# Patient Record
Sex: Male | Born: 1975 | Race: Black or African American | Hispanic: No | Marital: Single
Health system: Southern US, Community
[De-identification: ages and names within clinical notes are randomized; demographics above are authoritative.]

## PROBLEM LIST (undated history)

## (undated) DIAGNOSIS — N183 Chronic kidney disease, stage 3 unspecified: Secondary | ICD-10-CM

## (undated) DIAGNOSIS — G4733 Obstructive sleep apnea (adult) (pediatric): Secondary | ICD-10-CM

## (undated) DIAGNOSIS — J9621 Acute and chronic respiratory failure with hypoxia: Secondary | ICD-10-CM

## (undated) DIAGNOSIS — R1312 Dysphagia, oropharyngeal phase: Secondary | ICD-10-CM

## (undated) DIAGNOSIS — E662 Morbid (severe) obesity with alveolar hypoventilation: Secondary | ICD-10-CM

---

## 2020-09-10 ENCOUNTER — Inpatient Hospital Stay
Admission: AD | Admit: 2020-09-10 | Discharge: 2020-10-06 | Disposition: A | Discharge status: Home / Self Care | Payer: Medicare HMO | Source: Other Acute Inpatient Hospital | Attending: Internal Medicine | Admitting: Internal Medicine

## 2020-09-10 DIAGNOSIS — K5900 Constipation, unspecified: Secondary | ICD-10-CM

## 2020-09-10 DIAGNOSIS — R1312 Dysphagia, oropharyngeal phase: Secondary | ICD-10-CM | POA: Diagnosis present

## 2020-09-10 DIAGNOSIS — J9690 Respiratory failure, unspecified, unspecified whether with hypoxia or hypercapnia: Secondary | ICD-10-CM

## 2020-09-10 DIAGNOSIS — R1110 Vomiting, unspecified: Secondary | ICD-10-CM

## 2020-09-10 DIAGNOSIS — J189 Pneumonia, unspecified organism: Secondary | ICD-10-CM

## 2020-09-10 DIAGNOSIS — Z4659 Encounter for fitting and adjustment of other gastrointestinal appliance and device: Secondary | ICD-10-CM

## 2020-09-10 DIAGNOSIS — K567 Ileus, unspecified: Secondary | ICD-10-CM

## 2020-09-10 DIAGNOSIS — E662 Morbid (severe) obesity with alveolar hypoventilation: Secondary | ICD-10-CM | POA: Diagnosis present

## 2020-09-10 DIAGNOSIS — Z9911 Dependence on respirator [ventilator] status: Secondary | ICD-10-CM

## 2020-09-10 DIAGNOSIS — N183 Chronic kidney disease, stage 3 unspecified: Secondary | ICD-10-CM | POA: Diagnosis present

## 2020-09-10 DIAGNOSIS — J9621 Acute and chronic respiratory failure with hypoxia: Secondary | ICD-10-CM | POA: Diagnosis present

## 2020-09-10 DIAGNOSIS — G4733 Obstructive sleep apnea (adult) (pediatric): Secondary | ICD-10-CM | POA: Diagnosis present

## 2020-09-10 HISTORY — DX: Chronic kidney disease, stage 3 unspecified: N18.30

## 2020-09-10 HISTORY — DX: Dysphagia, oropharyngeal phase: R13.12

## 2020-09-10 HISTORY — DX: Morbid (severe) obesity with alveolar hypoventilation: E66.2

## 2020-09-10 HISTORY — DX: Obstructive sleep apnea (adult) (pediatric): G47.33

## 2020-09-10 HISTORY — DX: Morbid (severe) obesity due to excess calories: E66.01

## 2020-09-10 HISTORY — DX: Acute and chronic respiratory failure with hypoxia: J96.21

## 2020-09-11 ENCOUNTER — Other Ambulatory Visit (HOSPITAL_COMMUNITY): Payer: Self-pay

## 2020-09-11 ENCOUNTER — Encounter: Payer: Self-pay | Admitting: Internal Medicine

## 2020-09-11 DIAGNOSIS — N183 Chronic kidney disease, stage 3 unspecified: Secondary | ICD-10-CM

## 2020-09-11 DIAGNOSIS — G4733 Obstructive sleep apnea (adult) (pediatric): Secondary | ICD-10-CM | POA: Diagnosis not present

## 2020-09-11 DIAGNOSIS — J9621 Acute and chronic respiratory failure with hypoxia: Secondary | ICD-10-CM | POA: Diagnosis not present

## 2020-09-11 DIAGNOSIS — R1312 Dysphagia, oropharyngeal phase: Secondary | ICD-10-CM | POA: Diagnosis not present

## 2020-09-11 DIAGNOSIS — E662 Morbid (severe) obesity with alveolar hypoventilation: Secondary | ICD-10-CM | POA: Diagnosis present

## 2020-09-11 LAB — CBC WITH DIFFERENTIAL/PLATELET
Abs Immature Granulocytes: 0.01 10*3/uL (ref 0.00–0.07)
Basophils Absolute: 0 10*3/uL (ref 0.0–0.1)
Basophils Relative: 1 %
Eosinophils Absolute: 0 10*3/uL (ref 0.0–0.5)
Eosinophils Relative: 0 %
HCT: 49.4 % (ref 39.0–52.0)
Hemoglobin: 16.2 g/dL (ref 13.0–17.0)
Immature Granulocytes: 0 %
Lymphocytes Relative: 42 %
Lymphs Abs: 1.6 10*3/uL (ref 0.7–4.0)
MCH: 26.7 pg (ref 26.0–34.0)
MCHC: 32.8 g/dL (ref 30.0–36.0)
MCV: 81.5 fL (ref 80.0–100.0)
Monocytes Absolute: 0.6 10*3/uL (ref 0.1–1.0)
Monocytes Relative: 16 %
Neutro Abs: 1.6 10*3/uL — ABNORMAL LOW (ref 1.7–7.7)
Neutrophils Relative %: 41 %
Platelets: 165 10*3/uL (ref 150–400)
RBC: 6.06 MIL/uL — ABNORMAL HIGH (ref 4.22–5.81)
RDW: 18.4 % — ABNORMAL HIGH (ref 11.5–15.5)
WBC: 3.7 10*3/uL — ABNORMAL LOW (ref 4.0–10.5)
nRBC: 0 % (ref 0.0–0.2)

## 2020-09-11 LAB — BLOOD GAS, ARTERIAL
Acid-Base Excess: 8.1 mmol/L — ABNORMAL HIGH (ref 0.0–2.0)
Bicarbonate: 32.4 mmol/L — ABNORMAL HIGH (ref 20.0–28.0)
FIO2: 60
O2 Saturation: 90.1 %
Patient temperature: 36.7
pCO2 arterial: 46.9 mmHg (ref 32.0–48.0)
pH, Arterial: 7.452 — ABNORMAL HIGH (ref 7.350–7.450)
pO2, Arterial: 56.7 mmHg — ABNORMAL LOW (ref 83.0–108.0)

## 2020-09-11 LAB — COMPREHENSIVE METABOLIC PANEL
ALT: 38 U/L (ref 0–44)
AST: 25 U/L (ref 15–41)
Albumin: 2.5 g/dL — ABNORMAL LOW (ref 3.5–5.0)
Alkaline Phosphatase: 51 U/L (ref 38–126)
Anion gap: 9 (ref 5–15)
BUN: 5 mg/dL — ABNORMAL LOW (ref 6–20)
CO2: 29 mmol/L (ref 22–32)
Calcium: 8.7 mg/dL — ABNORMAL LOW (ref 8.9–10.3)
Chloride: 96 mmol/L — ABNORMAL LOW (ref 98–111)
Creatinine, Ser: 1.3 mg/dL — ABNORMAL HIGH (ref 0.61–1.24)
GFR, Estimated: >60 mL/min (ref >60)
Glucose, Bld: 106 mg/dL — ABNORMAL HIGH (ref 70–99)
Potassium: 3.3 mmol/L — ABNORMAL LOW (ref 3.5–5.1)
Sodium: 134 mmol/L — ABNORMAL LOW (ref 135–145)
Total Bilirubin: 0.6 mg/dL (ref 0.3–1.2)
Total Protein: 7.5 g/dL (ref 6.5–8.1)

## 2020-09-11 LAB — APTT: aPTT: 38 seconds — ABNORMAL HIGH (ref 24–36)

## 2020-09-11 LAB — PHOSPHORUS: Phosphorus: 2.4 mg/dL — ABNORMAL LOW (ref 2.5–4.6)

## 2020-09-11 LAB — MAGNESIUM: Magnesium: 1.5 mg/dL — ABNORMAL LOW (ref 1.7–2.4)

## 2020-09-11 LAB — PROTIME-INR
INR: 1.2 (ref 0.8–1.2)
Prothrombin Time: 14.9 seconds (ref 11.4–15.2)

## 2020-09-11 NOTE — Consult Note (Signed)
Pulmonary Critical Care Medicine [facility] [place]  PULMONARY SERVICE  Date of Service: 09/11/2020  PULMONARY CRITICAL CARE CONSULT   George Pennington  OFB:[id number]  DOB: 14-Dec-1975   DOA: 09/10/2020  Referring Physician: Carron Curie, MD  HPI: George Pennington is a 44 y.o. male seen for follow up of Acute on Chronic Respiratory Failure.  Patient has multiple medical problems including pickwickian syndrome oropharyngeal dysphagia hypernatremia polycythemia obstructive sleep apnea class III severe obesity schizophrenia bipolar disorder who presented to [facility] on November 6 with mental status changes and lethargy.  Patient also apparently is noncompliant with CPAP despite having severe sleep apnea.  Has been noted to have polycythemia felt to be secondary to OSA and obesity hypoventilation.  Patient also has a history of smoking.  When he presented his hemoglobin was 22.  Patient was intubated after failing attempts at BiPAP.  The patient subsequently was not able to come off the ventilator had a tracheostomy done.  Now presents to our facility for further management.  On evaluation the patient is focused on wanting to eat and also coming off the ventilator.  Reportedly the patient was allowed at the other facility to eat requiring 100% FiO2 however I feel like he is a very high risk to eat at this stage until we are able to do our own evaluation.  Review of Systems:  ROS performed and is unremarkable other than noted above.  Past Medical History:  Diagnosis Date  . Bipolar 1 disorder (*)  . Diabetes mellitus (*)  . Schizophrenia (*) Polycythemia Obstructive sleep apnea Severe morbid obesity Schizophrenia Pickwickian syndrome Stage II chronic kidney disease  Past Surgical History:  Procedure Laterality Date  . Hip arthroscopy w/ labral repair Right   Tracheostomy  Family history noncontributory to the present illness. Social history positive for tobacco  use unknown alcohol or drug abuse  Medications: Reviewed on Rounds  Physical Exam:  Vitals: Temperature 98.4 pulse 75 respiratory rate is 13 blood pressure 123/65 saturations 96%  Ventilator Settings assist control FiO2 60% FiO2 has not been able to be decreased tidal volume 420 PEEP 10  . General: Comfortable at this time . Eyes: Grossly normal lids, irises & conjunctiva . ENT: grossly tongue is normal . Neck: no obvious mass . Cardiovascular: S1-S2 normal no gallop or . Respiratory: Scattered rhonchi noted bilaterally . Abdomen: Soft and nontender . Skin: no rash seen on limited exam . Musculoskeletal: not rigid . Psychiatric:unable to assess . Neurologic: no seizure no involuntary movements         Labs on Admission:  Basic Metabolic Panel: Recent Labs  Lab 09/11/20 0352  NA 134*  K 3.3*  CL 96*  CO2 29  GLUCOSE 106*  BUN 5*  CREATININE 1.30*  CALCIUM 8.7*  MG 1.5*  PHOS 2.4*    Recent Labs  Lab 09/11/20 0750  PHART 7.452*  PCO2ART 46.9  PO2ART 56.7*  HCO3 32.4*  O2SAT 90.1    Liver Function Tests: Recent Labs  Lab 09/11/20 0352  AST 25  ALT 38  ALKPHOS 51  BILITOT 0.6  PROT 7.5  ALBUMIN 2.5*   No results for input(s): LIPASE, AMYLASE in the last 168 hours. No results for input(s): AMMONIA in the last 168 hours.  CBC: Recent Labs  Lab 09/11/20 0352  WBC 3.7*  NEUTROABS 1.6*  HGB 16.2  HCT 49.4  MCV 81.5  PLT 165    Cardiac Enzymes: No results for input(s): CKTOTAL, CKMB, CKMBINDEX, TROPONINI in  the last 168 hours.  BNP (last 3 results) No results for input(s): BNP in the last 8760 hours.  ProBNP (last 3 results) No results for input(s): PROBNP in the last 8760 hours.   Radiological Exams on Admission: DG Abd 1 View  Result Date: 09/11/2020 CLINICAL DATA:  Check gastric placement EXAM: ABDOMEN - 1 VIEW COMPARISON:  None FINDINGS: Gastric catheter is noted within the stomach. Fecal material is noted within the colon.  Postsurgical changes in the right acetabulum are seen. No other focal abnormality is noted. IMPRESSION: Gastric catheter within the stomach. Electronically Signed   By: Alcide Clever M.D.   On: 09/11/2020 09:47    Assessment/Plan Active Problems:   Acute on chronic respiratory failure with hypoxia (HCC)   Obstructive sleep apnea   Pickwickian syndrome (HCC)   Oropharyngeal dysphagia   Stage III chronic kidney disease (HCC)   Morbid obesity (HCC)   1. Acute on chronic respiratory failure with hypoxia at this time patient is on high oxygen requirements and a PEEP of 10.  Spoke with respiratory therapy during rounds we are going to try to see if we can first get the PEEP down and then try to wean FiO2 down if patient is able to do well with the weaning then we can consider starting on weaning protocol. 2. Obstructive sleep apnea with pickwickian syndrome patient has been noncompliant with PAP recommendations.  Basically he if he is able to wean from the ventilator he will need to keep the tracheostomy in place. 3. Stage II chronic kidney disease continue with supportive care monitor fluid status. 4. Dysphagia for now he will be kept n.p.o. we will have our speech therapist on review and evaluate him 5. Super morbid obesity will need dietary consultation.  I have personally seen and evaluated the patient, evaluated laboratory and imaging results, formulated the assessment and plan and placed orders. The Patient requires high complexity decision making with multiple systems involvement.  Case was discussed on Rounds with the Respiratory Therapy Director and the Respiratory staff Time Spent  Yevonne Pax, MD [facility] Pulmonary Critical Care Medicine Sleep Medicine

## 2020-09-12 ENCOUNTER — Other Ambulatory Visit (HOSPITAL_COMMUNITY): Payer: Self-pay

## 2020-09-12 DIAGNOSIS — J9621 Acute and chronic respiratory failure with hypoxia: Secondary | ICD-10-CM | POA: Diagnosis not present

## 2020-09-12 DIAGNOSIS — G4733 Obstructive sleep apnea (adult) (pediatric): Secondary | ICD-10-CM | POA: Diagnosis not present

## 2020-09-12 DIAGNOSIS — R1312 Dysphagia, oropharyngeal phase: Secondary | ICD-10-CM | POA: Diagnosis not present

## 2020-09-12 DIAGNOSIS — Z9911 Dependence on respirator [ventilator] status: Secondary | ICD-10-CM

## 2020-09-12 LAB — RENAL FUNCTION PANEL
Albumin: 2.4 g/dL — ABNORMAL LOW (ref 3.5–5.0)
Anion gap: 9 (ref 5–15)
BUN: <5 mg/dL — ABNORMAL LOW (ref 6–20)
CO2: 32 mmol/L (ref 22–32)
Calcium: 8.8 mg/dL — ABNORMAL LOW (ref 8.9–10.3)
Chloride: 95 mmol/L — ABNORMAL LOW (ref 98–111)
Creatinine, Ser: 1.05 mg/dL (ref 0.61–1.24)
GFR, Estimated: >60 mL/min (ref >60)
Glucose, Bld: 95 mg/dL (ref 70–99)
Phosphorus: 3.3 mg/dL (ref 2.5–4.6)
Potassium: 3.8 mmol/L (ref 3.5–5.1)
Sodium: 136 mmol/L (ref 135–145)

## 2020-09-12 LAB — BLOOD GAS, ARTERIAL
Acid-Base Excess: 9.8 mmol/L — ABNORMAL HIGH (ref 0.0–2.0)
Bicarbonate: 34.6 mmol/L — ABNORMAL HIGH (ref 20.0–28.0)
FIO2: 60
O2 Saturation: 93.5 %
Patient temperature: 36.7
pCO2 arterial: 53.7 mmHg — ABNORMAL HIGH (ref 32.0–48.0)
pH, Arterial: 7.423 (ref 7.350–7.450)
pO2, Arterial: 69.6 mmHg — ABNORMAL LOW (ref 83.0–108.0)

## 2020-09-12 LAB — MAGNESIUM: Magnesium: 1.8 mg/dL (ref 1.7–2.4)

## 2020-09-12 NOTE — Progress Notes (Signed)
Pulmonary Critical Care Medicine [facility] [place]   PULMONARY CRITICAL CARE SERVICE  PROGRESS NOTE  Date of Service: 09/12/2020  George Pennington  SEG:[id number]  DOB: 06/11/1976   DOA: 09/10/2020  Referring Physician: Carron Curie, MD  HPI: George Pennington is a 44 y.o. male seen for follow up of Acute on Chronic Respiratory Failure.  Patient currently is on assist control mode has been on 60% FiO2 with a PEEP of 10 chest x-ray revealing some right middle right lower lobe infiltrate  Medications: Reviewed on Rounds  Physical Exam:  Vitals: Temperature is 98.1 pulse 69 respiratory rate is 19 blood pressure is 126/74 saturations 98%  Ventilator Settings on assist control FiO2 60% tidal volume 420 PEEP 10  . General: Comfortable at this time . Eyes: Grossly normal lids, irises & conjunctiva . ENT: grossly tongue is normal . Neck: no obvious mass . Cardiovascular: S1 S2 normal no gallop . Respiratory: Scattered rhonchi expansion is equal . Abdomen: soft . Skin: no rash seen on limited exam . Musculoskeletal: not rigid . Psychiatric:unable to assess . Neurologic: no seizure no involuntary movements         Lab Data:   Basic Metabolic Panel: Recent Labs  Lab 09/11/20 0352 09/12/20 0359  NA 134* 136  K 3.3* 3.8  CL 96* 95*  CO2 29 32  GLUCOSE 106* 95  BUN 5* <5*  CREATININE 1.30* 1.05  CALCIUM 8.7* 8.8*  MG 1.5* 1.8  PHOS 2.4* 3.3    ABG: Recent Labs  Lab 09/11/20 0750 09/12/20 0655  PHART 7.452* 7.423  PCO2ART 46.9 53.7*  PO2ART 56.7* 69.6*  HCO3 32.4* 34.6*  O2SAT 90.1 93.5    Liver Function Tests: Recent Labs  Lab 09/11/20 0352 09/12/20 0359  AST 25  --   ALT 38  --   ALKPHOS 51  --   BILITOT 0.6  --   PROT 7.5  --   ALBUMIN 2.5* 2.4*   No results for input(s): LIPASE, AMYLASE in the last 168 hours. No results for input(s): AMMONIA in the last 168 hours.  CBC: Recent Labs  Lab 09/11/20 0352  WBC 3.7*  NEUTROABS 1.6*   HGB 16.2  HCT 49.4  MCV 81.5  PLT 165    Cardiac Enzymes: No results for input(s): CKTOTAL, CKMB, CKMBINDEX, TROPONINI in the last 168 hours.  BNP (last 3 results) No results for input(s): BNP in the last 8760 hours.  ProBNP (last 3 results) No results for input(s): PROBNP in the last 8760 hours.  Radiological Exams: DG Abd 1 View  Result Date: 09/12/2020 CLINICAL DATA:  Vomiting and ileus EXAM: ABDOMEN - 1 VIEW COMPARISON:  Film from the previous day. FINDINGS: Gastric catheter is noted within the stomach. Scattered large and small bowel gas is noted. Contrast material is noted within the colon. Postsurgical changes are noted in the right acetabulum. No free air is seen. IMPRESSION: Gastric catheter within the stomach. Contrast material within the colon. No obstructive changes are noted Electronically Signed   By: Alcide Clever M.D.   On: 09/12/2020 09:37   DG Abd 1 View  Result Date: 09/11/2020 CLINICAL DATA:  Check gastric placement EXAM: ABDOMEN - 1 VIEW COMPARISON:  None FINDINGS: Gastric catheter is noted within the stomach. Fecal material is noted within the colon. Postsurgical changes in the right acetabulum are seen. No other focal abnormality is noted. IMPRESSION: Gastric catheter within the stomach. Electronically Signed   By: Alcide Clever M.D.   On: 09/11/2020 09:47  DG CHEST PORT 1 VIEW  Result Date: 09/11/2020 CLINICAL DATA:  Respiratory failure EXAM: PORTABLE CHEST 1 VIEW COMPARISON:  None. FINDINGS: Tracheostomy tube is noted in satisfactory position. Gastric catheter is noted within the stomach. Cardiac shadow is at the upper limits of normal in size. Patchy right basilar infiltrate is seen. No bony abnormality is noted IMPRESSION: Patchy right basilar infiltrate. Tubes and lines in satisfactory position Electronically Signed   By: Alcide Clever M.D.   On: 09/11/2020 13:26    Assessment/Plan Active Problems:   Acute on chronic respiratory failure with hypoxia (HCC)    Obstructive sleep apnea   Pickwickian syndrome (HCC)   Oropharyngeal dysphagia   Stage III chronic kidney disease (HCC)   Morbid obesity (HCC)   1. Acute on chronic respiratory failure hypoxia oxygen requirements are still elevated significantly to 60%.  Chest x-ray still showing some infiltrate although off on doing any weaning right now 2. Obstructive sleep apnea with pickwickian syndrome on the ventilator full support 3. Oropharyngeal dysphagia likely has ongoing aspiration no feeding for now with speech therapy to evaluate 4. Chronic kidney disease supportive care monitor labs 5. Super morbid obesity we will continue to follow prognosis is guarded   I have personally seen and evaluated the patient, evaluated laboratory and imaging results, formulated the assessment and plan and placed orders. The Patient requires high complexity decision making with multiple systems involvement.  Rounds were done with the Respiratory Therapy Director and Staff therapists and discussed with nursing staff also.  Yevonne Pax, MD [facility] Pulmonary Critical Care Medicine Sleep Medicine

## 2020-09-13 ENCOUNTER — Other Ambulatory Visit (HOSPITAL_COMMUNITY): Payer: Self-pay

## 2020-09-13 DIAGNOSIS — G4733 Obstructive sleep apnea (adult) (pediatric): Secondary | ICD-10-CM | POA: Diagnosis not present

## 2020-09-13 DIAGNOSIS — R1312 Dysphagia, oropharyngeal phase: Secondary | ICD-10-CM | POA: Diagnosis not present

## 2020-09-13 DIAGNOSIS — J9621 Acute and chronic respiratory failure with hypoxia: Secondary | ICD-10-CM | POA: Diagnosis not present

## 2020-09-13 LAB — CBC
HCT: 51.3 % (ref 39.0–52.0)
Hemoglobin: 16.1 g/dL (ref 13.0–17.0)
MCH: 25.8 pg — ABNORMAL LOW (ref 26.0–34.0)
MCHC: 31.4 g/dL (ref 30.0–36.0)
MCV: 82.2 fL (ref 80.0–100.0)
Platelets: 156 10*3/uL (ref 150–400)
RBC: 6.24 MIL/uL — ABNORMAL HIGH (ref 4.22–5.81)
RDW: 18.3 % — ABNORMAL HIGH (ref 11.5–15.5)
WBC: 3.5 10*3/uL — ABNORMAL LOW (ref 4.0–10.5)
nRBC: 0 % (ref 0.0–0.2)

## 2020-09-13 LAB — BLOOD GAS, ARTERIAL
Acid-Base Excess: 7.8 mmol/L — ABNORMAL HIGH (ref 0.0–2.0)
Bicarbonate: 32.3 mmol/L — ABNORMAL HIGH (ref 20.0–28.0)
FIO2: 45
O2 Saturation: 91.8 %
Patient temperature: 36.9
pCO2 arterial: 49.2 mmHg — ABNORMAL HIGH (ref 32.0–48.0)
pH, Arterial: 7.432 (ref 7.350–7.450)
pO2, Arterial: 65.4 mmHg — ABNORMAL LOW (ref 83.0–108.0)

## 2020-09-13 LAB — MAGNESIUM: Magnesium: 1.5 mg/dL — ABNORMAL LOW (ref 1.7–2.4)

## 2020-09-13 LAB — CULTURE, RESPIRATORY W GRAM STAIN

## 2020-09-13 LAB — BASIC METABOLIC PANEL
Anion gap: 11 (ref 5–15)
BUN: <5 mg/dL — ABNORMAL LOW (ref 6–20)
CO2: 29 mmol/L (ref 22–32)
Calcium: 8.6 mg/dL — ABNORMAL LOW (ref 8.9–10.3)
Chloride: 95 mmol/L — ABNORMAL LOW (ref 98–111)
Creatinine, Ser: 1.16 mg/dL (ref 0.61–1.24)
GFR, Estimated: >60 mL/min (ref >60)
Glucose, Bld: 82 mg/dL (ref 70–99)
Potassium: 3.6 mmol/L (ref 3.5–5.1)
Sodium: 135 mmol/L (ref 135–145)

## 2020-09-13 LAB — VANCOMYCIN, TROUGH: Vancomycin Tr: 16 ug/mL (ref 15–20)

## 2020-09-13 NOTE — Progress Notes (Signed)
Pulmonary Critical Care Medicine [facility] [place]   PULMONARY CRITICAL CARE SERVICE  PROGRESS NOTE  Date of Service: 09/13/2020  George Pennington  EGB:[id number]  DOB: [birth year]   DOA: 09/10/2020  Referring Physician: Carron Curie, MD  HPI: George Pennington is a 44 y.o. male seen for follow up of Acute on Chronic Respiratory Failure.  Patient currently is on pressure support has been on 45% FiO2 currently on pressure of 10/7  Medications: Reviewed on Rounds  Physical Exam:  Vitals: Temperature is 98.3 pulse 81 respiratory rate is 25 blood pressure is 122/77 saturations 95  Ventilator Settings on pressure support FiO2 45% pressure 10/7  . General: Comfortable at this time . Eyes: Grossly normal lids, irises & conjunctiva . ENT: grossly tongue is normal . Neck: no obvious mass . Cardiovascular: S1 S2 normal no gallop . Respiratory: No rhonchi very coarse breath sounds . Abdomen: soft . Skin: no rash seen on limited exam . Musculoskeletal: not rigid . Psychiatric:unable to assess . Neurologic: no seizure no involuntary movements         Lab Data:   Basic Metabolic Panel: Recent Labs  Lab 09/11/20 0352 09/12/20 0359 09/13/20 0609  NA 134* 136 135  K 3.3* 3.8 3.6  CL 96* 95* 95*  CO2 29 32 29  GLUCOSE 106* 95 82  BUN 5* <5* <5*  CREATININE 1.30* 1.05 1.16  CALCIUM 8.7* 8.8* 8.6*  MG 1.5* 1.8 1.5*  PHOS 2.4* 3.3  --     ABG: Recent Labs  Lab 09/11/20 0750 09/12/20 0655 09/13/20 0600  PHART 7.452* 7.423 7.432  PCO2ART 46.9 53.7* 49.2*  PO2ART 56.7* 69.6* 65.4*  HCO3 32.4* 34.6* 32.3*  O2SAT 90.1 93.5 91.8    Liver Function Tests: Recent Labs  Lab 09/11/20 0352 09/12/20 0359  AST 25  --   ALT 38  --   ALKPHOS 51  --   BILITOT 0.6  --   PROT 7.5  --   ALBUMIN 2.5* 2.4*   No results for input(s): LIPASE, AMYLASE in the last 168 hours. No results for input(s): AMMONIA in the last 168 hours.  CBC: Recent Labs  Lab 09/11/20 0352  09/13/20 0609  WBC 3.7* 3.5*  NEUTROABS 1.6*  --   HGB 16.2 16.1  HCT 49.4 51.3  MCV 81.5 82.2  PLT 165 156    Cardiac Enzymes: No results for input(s): CKTOTAL, CKMB, CKMBINDEX, TROPONINI in the last 168 hours.  BNP (last 3 results) No results for input(s): BNP in the last 8760 hours.  ProBNP (last 3 results) No results for input(s): PROBNP in the last 8760 hours.  Radiological Exams: DG Abd 1 View  Result Date: 09/12/2020 CLINICAL DATA:  Vomiting and ileus EXAM: ABDOMEN - 1 VIEW COMPARISON:  Film from the previous day. FINDINGS: Gastric catheter is noted within the stomach. Scattered large and small bowel gas is noted. Contrast material is noted within the colon. Postsurgical changes are noted in the right acetabulum. No free air is seen. IMPRESSION: Gastric catheter within the stomach. Contrast material within the colon. No obstructive changes are noted Electronically Signed   By: Alcide Clever M.D.   On: 09/12/2020 09:37   DG Abd 1 View  Result Date: 09/11/2020 CLINICAL DATA:  Check gastric placement EXAM: ABDOMEN - 1 VIEW COMPARISON:  None FINDINGS: Gastric catheter is noted within the stomach. Fecal material is noted within the colon. Postsurgical changes in the right acetabulum are seen. No other focal abnormality is noted. IMPRESSION: Gastric  catheter within the stomach. Electronically Signed   By: Alcide Clever M.D.   On: 09/11/2020 09:47   DG CHEST PORT 1 VIEW  Result Date: 09/11/2020 CLINICAL DATA:  Respiratory failure EXAM: PORTABLE CHEST 1 VIEW COMPARISON:  None. FINDINGS: Tracheostomy tube is noted in satisfactory position. Gastric catheter is noted within the stomach. Cardiac shadow is at the upper limits of normal in size. Patchy right basilar infiltrate is seen. No bony abnormality is noted IMPRESSION: Patchy right basilar infiltrate. Tubes and lines in satisfactory position Electronically Signed   By: Alcide Clever M.D.   On: 09/11/2020 13:26    Assessment/Plan Active  Problems:   Acute on chronic respiratory failure with hypoxia (HCC)   Obstructive sleep apnea   Pickwickian syndrome (HCC)   Oropharyngeal dysphagia   Stage III chronic kidney disease (HCC)   Morbid obesity (HCC)   1. Acute on chronic respiratory failure hypoxia we will continue with the pressure support weaning as ordered I have written an order to advance as tolerated 2. Obstructive sleep apnea we will continue with support and airway to be monitored. 3. Pickwickian syndrome as above 4. Oropharyngeal dysphagia 5. Stage III chronic kidney disease supportive care 6. Morbid obesity at baseline   I have personally seen and evaluated the patient, evaluated laboratory and imaging results, formulated the assessment and plan and placed orders. The Patient requires high complexity decision making with multiple systems involvement.  Rounds were done with the Respiratory Therapy Director and Staff therapists and discussed with nursing staff also.  Yevonne Pax, MD [facility] Pulmonary Critical Care Medicine Sleep Medicine

## 2020-09-14 LAB — RENAL FUNCTION PANEL
Albumin: 2.3 g/dL — ABNORMAL LOW (ref 3.5–5.0)
Anion gap: 11 (ref 5–15)
BUN: 5 mg/dL — ABNORMAL LOW (ref 6–20)
CO2: 29 mmol/L (ref 22–32)
Calcium: 8.7 mg/dL — ABNORMAL LOW (ref 8.9–10.3)
Chloride: 96 mmol/L — ABNORMAL LOW (ref 98–111)
Creatinine, Ser: 1.15 mg/dL (ref 0.61–1.24)
GFR, Estimated: >60 mL/min (ref >60)
Glucose, Bld: 109 mg/dL — ABNORMAL HIGH (ref 70–99)
Phosphorus: 3.3 mg/dL (ref 2.5–4.6)
Potassium: 3.7 mmol/L (ref 3.5–5.1)
Sodium: 136 mmol/L (ref 135–145)

## 2020-09-14 LAB — CBC
HCT: 51.1 % (ref 39.0–52.0)
Hemoglobin: 16.1 g/dL (ref 13.0–17.0)
MCH: 25.8 pg — ABNORMAL LOW (ref 26.0–34.0)
MCHC: 31.5 g/dL (ref 30.0–36.0)
MCV: 82 fL (ref 80.0–100.0)
Platelets: 157 10*3/uL (ref 150–400)
RBC: 6.23 MIL/uL — ABNORMAL HIGH (ref 4.22–5.81)
RDW: 18.3 % — ABNORMAL HIGH (ref 11.5–15.5)
WBC: 3.6 10*3/uL — ABNORMAL LOW (ref 4.0–10.5)
nRBC: 0 % (ref 0.0–0.2)

## 2020-09-14 LAB — MAGNESIUM: Magnesium: 1.9 mg/dL (ref 1.7–2.4)

## 2020-09-15 DIAGNOSIS — J9621 Acute and chronic respiratory failure with hypoxia: Secondary | ICD-10-CM | POA: Diagnosis not present

## 2020-09-15 DIAGNOSIS — G4733 Obstructive sleep apnea (adult) (pediatric): Secondary | ICD-10-CM | POA: Diagnosis not present

## 2020-09-15 DIAGNOSIS — R1312 Dysphagia, oropharyngeal phase: Secondary | ICD-10-CM | POA: Diagnosis not present

## 2020-09-15 NOTE — Progress Notes (Signed)
Pulmonary Critical Care Medicine [facility] [place]   PULMONARY CRITICAL CARE SERVICE  PROGRESS NOTE  Date of Service: 09/15/2020  George Pennington  QMG:[id number]  DOB: 10-13-1975   DOA: 09/10/2020  Referring Physician: Carron Curie, MD  HPI: George Pennington is a 44 y.o. male seen for follow up of Acute on Chronic Respiratory Failure.  Patient is on pressure support will try to do T collar today  Medications: Reviewed on Rounds  Physical Exam:  Vitals: Temperature is 98.7 pulse 63 respiratory 15 blood pressure is 111/60 saturations 93%  Ventilator Settings on pressure support FiO2 is 45% pressure 12/5  . General: Comfortable at this time . Eyes: Grossly normal lids, irises & conjunctiva . ENT: grossly tongue is normal . Neck: no obvious mass . Cardiovascular: S1 S2 normal no gallop . Respiratory: No rhonchi very coarse breath sounds . Abdomen: soft . Skin: no rash seen on limited exam . Musculoskeletal: not rigid . Psychiatric:unable to assess . Neurologic: no seizure no involuntary movements         Lab Data:   Basic Metabolic Panel: Recent Labs  Lab 09/11/20 0352 09/12/20 0359 09/13/20 0609 09/14/20 0718  NA 134* 136 135 136  K 3.3* 3.8 3.6 3.7  CL 96* 95* 95* 96*  CO2 29 32 29 29  GLUCOSE 106* 95 82 109*  BUN 5* <5* <5* 5*  CREATININE 1.30* 1.05 1.16 1.15  CALCIUM 8.7* 8.8* 8.6* 8.7*  MG 1.5* 1.8 1.5* 1.9  PHOS 2.4* 3.3  --  3.3    ABG: Recent Labs  Lab 09/11/20 0750 09/12/20 0655 09/13/20 0600  PHART 7.452* 7.423 7.432  PCO2ART 46.9 53.7* 49.2*  PO2ART 56.7* 69.6* 65.4*  HCO3 32.4* 34.6* 32.3*  O2SAT 90.1 93.5 91.8    Liver Function Tests: Recent Labs  Lab 09/11/20 0352 09/12/20 0359 09/14/20 0718  AST 25  --   --   ALT 38  --   --   ALKPHOS 51  --   --   BILITOT 0.6  --   --   PROT 7.5  --   --   ALBUMIN 2.5* 2.4* 2.3*   No results for input(s): LIPASE, AMYLASE in the last 168 hours. No results for input(s): AMMONIA  in the last 168 hours.  CBC: Recent Labs  Lab 09/11/20 0352 09/13/20 0609 09/14/20 0718  WBC 3.7* 3.5* 3.6*  NEUTROABS 1.6*  --   --   HGB 16.2 16.1 16.1  HCT 49.4 51.3 51.1  MCV 81.5 82.2 82.0  PLT 165 156 157    Cardiac Enzymes: No results for input(s): CKTOTAL, CKMB, CKMBINDEX, TROPONINI in the last 168 hours.  BNP (last 3 results) No results for input(s): BNP in the last 8760 hours.  ProBNP (last 3 results) No results for input(s): PROBNP in the last 8760 hours.  Radiological Exams: No results found.  Assessment/Plan Active Problems:   Acute on chronic respiratory failure with hypoxia (HCC)   Obstructive sleep apnea   Pickwickian syndrome (HCC)   Oropharyngeal dysphagia   Stage III chronic kidney disease (HCC)   Morbid obesity (HCC)   1. Acute on chronic respiratory failure with hypoxia we will continue with T collar. 2. Obstructive sleep apnea patient is at baseline 3. Pickwickian no change 4. Oropharyngeal dysphagia will follow up with speech therapy 5. Stage III chronic kidney disease following labs 6. Super morbid obesity dietary management   I have personally seen and evaluated the patient, evaluated laboratory and imaging results, formulated the  assessment and plan and placed orders. The Patient requires high complexity decision making with multiple systems involvement.  Rounds were done with the Respiratory Therapy Director and Staff therapists and discussed with nursing staff also.  Allyne Gee, MD [facility] Pulmonary Critical Care Medicine Sleep Medicine

## 2020-09-16 DIAGNOSIS — R1312 Dysphagia, oropharyngeal phase: Secondary | ICD-10-CM | POA: Diagnosis not present

## 2020-09-16 DIAGNOSIS — G4733 Obstructive sleep apnea (adult) (pediatric): Secondary | ICD-10-CM | POA: Diagnosis not present

## 2020-09-16 DIAGNOSIS — J9621 Acute and chronic respiratory failure with hypoxia: Secondary | ICD-10-CM | POA: Diagnosis not present

## 2020-09-16 LAB — RENAL FUNCTION PANEL
Albumin: 2.5 g/dL — ABNORMAL LOW (ref 3.5–5.0)
Anion gap: 10 (ref 5–15)
BUN: 11 mg/dL (ref 6–20)
CO2: 26 mmol/L (ref 22–32)
Calcium: 8.8 mg/dL — ABNORMAL LOW (ref 8.9–10.3)
Chloride: 99 mmol/L (ref 98–111)
Creatinine, Ser: 1.22 mg/dL (ref 0.61–1.24)
GFR, Estimated: >60 mL/min (ref >60)
Glucose, Bld: 106 mg/dL — ABNORMAL HIGH (ref 70–99)
Phosphorus: 2.6 mg/dL (ref 2.5–4.6)
Potassium: 3.5 mmol/L (ref 3.5–5.1)
Sodium: 135 mmol/L (ref 135–145)

## 2020-09-16 LAB — CBC
HCT: 51.7 % (ref 39.0–52.0)
Hemoglobin: 16.4 g/dL (ref 13.0–17.0)
MCH: 26.1 pg (ref 26.0–34.0)
MCHC: 31.7 g/dL (ref 30.0–36.0)
MCV: 82.2 fL (ref 80.0–100.0)
Platelets: 168 10*3/uL (ref 150–400)
RBC: 6.29 MIL/uL — ABNORMAL HIGH (ref 4.22–5.81)
RDW: 18.8 % — ABNORMAL HIGH (ref 11.5–15.5)
WBC: 5.2 10*3/uL (ref 4.0–10.5)
nRBC: 0 % (ref 0.0–0.2)

## 2020-09-16 LAB — MAGNESIUM: Magnesium: 1.6 mg/dL — ABNORMAL LOW (ref 1.7–2.4)

## 2020-09-16 NOTE — Progress Notes (Signed)
Pulmonary Critical Care Medicine [facility] [place]   PULMONARY CRITICAL CARE SERVICE  PROGRESS NOTE  Date of Service: 09/16/2020  George Pennington  PRF:[id number]  DOB: 1976/07/09   DOA: 09/10/2020  Referring Physician: Carron Curie, MD  HPI: George Pennington is a 44 y.o. male seen for follow up of Acute on Chronic Respiratory Failure.  Patient is currently off the ventilator completed 8 hours on T collar yesterday  Medications: Reviewed on Rounds  Physical Exam:  Vitals: Temperature is 99.0 pulse 80 respiratory 18 blood pressure is 111/63 saturations 93%  Ventilator Settings wean on T collar with PMV 40%  . General: Comfortable at this time . Eyes: Grossly normal lids, irises & conjunctiva . ENT: grossly tongue is normal . Neck: no obvious mass . Cardiovascular: S1 S2 normal no gallop . Respiratory: No rhonchi coarse breath sounds . Abdomen: soft . Skin: no rash seen on limited exam . Musculoskeletal: not rigid . Psychiatric:unable to assess . Neurologic: no seizure no involuntary movements         Lab Data:   Basic Metabolic Panel: Recent Labs  Lab 09/11/20 0352 09/12/20 0359 09/13/20 0609 09/14/20 0718 09/16/20 0436  NA 134* 136 135 136 135  K 3.3* 3.8 3.6 3.7 3.5  CL 96* 95* 95* 96* 99  CO2 29 32 29 29 26   GLUCOSE 106* 95 82 109* 106*  BUN 5* <5* <5* 5* 11  CREATININE 1.30* 1.05 1.16 1.15 1.22  CALCIUM 8.7* 8.8* 8.6* 8.7* 8.8*  MG 1.5* 1.8 1.5* 1.9 1.6*  PHOS 2.4* 3.3  --  3.3 2.6    ABG: Recent Labs  Lab 09/11/20 0750 09/12/20 0655 09/13/20 0600  PHART 7.452* 7.423 7.432  PCO2ART 46.9 53.7* 49.2*  PO2ART 56.7* 69.6* 65.4*  HCO3 32.4* 34.6* 32.3*  O2SAT 90.1 93.5 91.8    Liver Function Tests: Recent Labs  Lab 09/11/20 0352 09/12/20 0359 09/14/20 0718 09/16/20 0436  AST 25  --   --   --   ALT 38  --   --   --   ALKPHOS 51  --   --   --   BILITOT 0.6  --   --   --   PROT 7.5  --   --   --   ALBUMIN 2.5* 2.4* 2.3* 2.5*    No results for input(s): LIPASE, AMYLASE in the last 168 hours. No results for input(s): AMMONIA in the last 168 hours.  CBC: Recent Labs  Lab 09/11/20 0352 09/13/20 0609 09/14/20 0718 09/16/20 0436  WBC 3.7* 3.5* 3.6* 5.2  NEUTROABS 1.6*  --   --   --   HGB 16.2 16.1 16.1 16.4  HCT 49.4 51.3 51.1 51.7  MCV 81.5 82.2 82.0 82.2  PLT 165 156 157 168    Cardiac Enzymes: No results for input(s): CKTOTAL, CKMB, CKMBINDEX, TROPONINI in the last 168 hours.  BNP (last 3 results) No results for input(s): BNP in the last 8760 hours.  ProBNP (last 3 results) No results for input(s): PROBNP in the last 8760 hours.  Radiological Exams: No results found.  Assessment/Plan Active Problems:   Acute on chronic respiratory failure with hypoxia (HCC)   Obstructive sleep apnea   Pickwickian syndrome (HCC)   Oropharyngeal dysphagia   Stage III chronic kidney disease (HCC)   Morbid obesity (HCC)   1. Acute on chronic respiratory failure with hypoxia we will wean on T-piece.  Patient is on 40% FiO2 using PMV. 2. Obstructive sleep apnea with pickwickian  syndrome continue with supportive care dietary management 3. Oropharyngeal dysphagia speech therapy 4. Stage III kidney disease following labs 5. Super morbid obesity at baseline   I have personally seen and evaluated the patient, evaluated laboratory and imaging results, formulated the assessment and plan and placed orders. The Patient requires high complexity decision making with multiple systems involvement.  Rounds were done with the Respiratory Therapy Director and Staff therapists and discussed with nursing staff also.  Yevonne Pax, MD [facility] Pulmonary Critical Care Medicine Sleep Medicine

## 2020-09-16 NOTE — Consult Note (Signed)
Referring Physician: Sharyon Medicus, MD  George Pennington is an 44 y.o. male.                       Chief Complaint: Abnormal cardiac rhythm  HPI: 44 years old male with PMH of pickwickian syndrome, oropharyngeal dysphagia, polycythemia had acute on chronic respiratory failure with hypoxia. He also has morbid obesity and stage III CKD. His monitor showed sinus rhythm with baseline noise mimicking VT.  Patient denies chest pain, palpitations or dizziness.   Past Medical History:  Diagnosis Date  . Acute on chronic respiratory failure with hypoxia (HCC)   . Morbid obesity (HCC)   . Obstructive sleep apnea   . Oropharyngeal dysphagia   . Pickwickian syndrome (HCC)   . Stage III chronic kidney disease (HCC)     Past Medical History:     Diagnosis:         Bipolar 1 disorder (*)         Diabetes mellitus (*)         Schizophrenia (*) Polycythemia Obstructive sleep apnea Severe morbid obesity Schizophrenia Pickwickian syndrome Stage II chronic kidney disease  Past Surgical History:  Procedure Laterality Date  Hip arthroscopy w/ labral repair Right  Tracheostomy  The histories are not reviewed yet. Please review them in the "History" navigator section and refresh this SmartLink.  No family history on file. Social History:  has no history on file for tobacco use, alcohol use, and drug use.  Allergies: Not on File  No medications prior to admission.    Results for orders placed or performed during the hospital encounter of 09/10/20 (from the past 48 hour(s))  CBC     Status: Abnormal   Collection Time: 09/16/20  4:36 AM  Result Value Ref Range   WBC 5.2 4.0 - 10.5 K/uL   RBC 6.29 (H) 4.22 - 5.81 MIL/uL   Hemoglobin 16.4 13.0 - 17.0 g/dL   HCT 39.7 67.3 - 41.9 %   MCV 82.2 80.0 - 100.0 fL   MCH 26.1 26.0 - 34.0 pg   MCHC 31.7 30.0 - 36.0 g/dL   RDW 37.9 (H) 02.4 - 09.7 %   Platelets 168 150 - 400 K/uL    Comment: REPEATED TO VERIFY   nRBC 0.0 0.0 - 0.2 %    Comment: Performed  at [facility], [address], [place], [place] [address]  Renal function panel     Status: Abnormal   Collection Time: 09/16/20  4:36 AM  Result Value Ref Range   Sodium 135 135 - 145 mmol/L   Potassium 3.5 3.5 - 5.1 mmol/L   Chloride 99 98 - 111 mmol/L   CO2 26 22 - 32 mmol/L   Glucose, Bld 106 (H) 70 - 99 mg/dL    Comment: Glucose reference range applies only to samples taken after fasting for at least 8 hours.   BUN 11 6 - 20 mg/dL   Creatinine, Ser 9.24 0.61 - 1.24 mg/dL   Calcium 8.8 (L) 8.9 - 10.3 mg/dL   Phosphorus 2.6 2.5 - 4.6 mg/dL   Albumin 2.5 (L) 3.5 - 5.0 g/dL   GFR, Estimated >26 >83 mL/min    Comment: (NOTE) Calculated using the CKD-EPI Creatinine Equation (2021)    Anion gap 10 5 - 15    Comment: Performed at [facility], [address] [place], [place] [address]  Magnesium     Status: Abnormal   Collection Time: 09/16/20  4:36 AM  Result Value Ref Range   Magnesium 1.6 (L) 1.7 - 2.4 mg/dL    Comment: Performed at [facility], [address] [place], [place] [address]   No results found.  Review Of Systems Constitutional: No fever, chills, chronic weight gain. Eyes: No vision change, wears glasses. No discharge or pain. Ears: No hearing loss, No tinnitus. Respiratory: No asthma, COPD, pneumonias, positive shortness of breath. No hemoptysis. Cardiovascular: No chest pain, palpitation, leg edema. Gastrointestinal: No nausea, vomiting, diarrhea, constipation. No GI bleed. No hepatitis. Genitourinary: No dysuria, hematuria, kidney stone. No incontinance. Neurological: No headache, stroke, seizures.  Psychiatry: No psych facility admission for anxiety, depression, suicide. No detox. Skin: No rash. Musculoskeletal: No joint pain, fibromyalgia. No neck pain, back pain. Lymphadenopathy: No lymphadenopathy. Hematology: No anemia or easy bruising.  P: 63, Sinus rhythm, BP: 126/72, RR: 30, O2 sat 98 % on T collar. There were no vitals taken  for this visit. There is no height or weight on file to calculate BMI. General appearance: alert, cooperative, appears stated age and no distress Head: Normocephalic, atraumatic. Eyes: Brown eyes, pink conjunctiva, corneas clear. PERRL, EOM's intact. Neck: No adenopathy, no carotid bruit, no JVD, supple, symmetrical, trachea midline and thyroid not enlarged. Resp: Clear to auscultation bilaterally. Cardio: Regular rate and rhythm, S1, S2 normal, II/VI systolic murmur, no click, rub or gallop GI: Soft, non-tender; bowel sounds normal; no organomegaly. Extremities: No edema, cyanosis or clubbing. Skin: Warm and dry.  Neurologic: Alert and oriented X 3, normal strength. Normal coordination and gait.  Assessment/Plan Acute on chronic respiratory failure with hypoxia OSA Pickwickian syndrome Oropharyngeal dysphagia Morbid obesity Artifact on rhythm monitor tracing, No VT  Continue medical treatment.  Time spent: Review of old records, Lab, x-rays, EKG, other cardiac tests, examination, discussion with patient over 60 minutes.  [name], MD  09/16/2020, 7:42 PM

## 2020-09-17 DIAGNOSIS — G4733 Obstructive sleep apnea (adult) (pediatric): Secondary | ICD-10-CM | POA: Diagnosis not present

## 2020-09-17 DIAGNOSIS — R1312 Dysphagia, oropharyngeal phase: Secondary | ICD-10-CM | POA: Diagnosis not present

## 2020-09-17 DIAGNOSIS — J9621 Acute and chronic respiratory failure with hypoxia: Secondary | ICD-10-CM | POA: Diagnosis not present

## 2020-09-17 LAB — BASIC METABOLIC PANEL
Anion gap: 10 (ref 5–15)
BUN: 8 mg/dL (ref 6–20)
CO2: 27 mmol/L (ref 22–32)
Calcium: 8.6 mg/dL — ABNORMAL LOW (ref 8.9–10.3)
Chloride: 98 mmol/L (ref 98–111)
Creatinine, Ser: 1.15 mg/dL (ref 0.61–1.24)
GFR, Estimated: >60 mL/min (ref >60)
Glucose, Bld: 102 mg/dL — ABNORMAL HIGH (ref 70–99)
Potassium: 3.6 mmol/L (ref 3.5–5.1)
Sodium: 135 mmol/L (ref 135–145)

## 2020-09-17 LAB — MAGNESIUM: Magnesium: 1.8 mg/dL (ref 1.7–2.4)

## 2020-09-17 NOTE — Progress Notes (Signed)
Pulmonary Critical Care Medicine [facility] [place]   PULMONARY CRITICAL CARE SERVICE  PROGRESS NOTE  Date of Service: 09/17/2020  George Pennington  AJG:[id number]  DOB: 1975-12-08   DOA: 09/10/2020  Referring Physician: Carron Curie, MD  HPI: George Pennington is a 44 y.o. male seen for follow up of Acute on Chronic Respiratory Failure.  Patient is on T collar currently on 40% FiO2 is below goal of 20 hours  Medications: Reviewed on Rounds  Physical Exam:  Vitals: Temperature 98.6 pulse 60 respiratory 20 blood pressure is 118/68 saturations 97%  Ventilator Settings on T collar with an FiO2 40%  . General: Comfortable at this time . Eyes: Grossly normal lids, irises & conjunctiva . ENT: grossly tongue is normal . Neck: no obvious mass . Cardiovascular: S1 S2 normal no gallop . Respiratory: No rhonchi no rales are noted . Abdomen: soft . Skin: no rash seen on limited exam . Musculoskeletal: not rigid . Psychiatric:unable to assess . Neurologic: no seizure no involuntary movements         Lab Data:   Basic Metabolic Panel: Recent Labs  Lab 09/11/20 0352 09/12/20 0359 09/13/20 0609 09/14/20 0718 09/16/20 0436 09/17/20 0401  NA 134* 136 135 136 135 135  K 3.3* 3.8 3.6 3.7 3.5 3.6  CL 96* 95* 95* 96* 99 98  CO2 29 32 29 29 26 27   GLUCOSE 106* 95 82 109* 106* 102*  BUN 5* <5* <5* 5* 11 8  CREATININE 1.30* 1.05 1.16 1.15 1.22 1.15  CALCIUM 8.7* 8.8* 8.6* 8.7* 8.8* 8.6*  MG 1.5* 1.8 1.5* 1.9 1.6* 1.8  PHOS 2.4* 3.3  --  3.3 2.6  --     ABG: Recent Labs  Lab 09/11/20 0750 09/12/20 0655 09/13/20 0600  PHART 7.452* 7.423 7.432  PCO2ART 46.9 53.7* 49.2*  PO2ART 56.7* 69.6* 65.4*  HCO3 32.4* 34.6* 32.3*  O2SAT 90.1 93.5 91.8    Liver Function Tests: Recent Labs  Lab 09/11/20 0352 09/12/20 0359 09/14/20 0718 09/16/20 0436  AST 25  --   --   --   ALT 38  --   --   --   ALKPHOS 51  --   --   --   BILITOT 0.6  --   --   --   PROT 7.5  --    --   --   ALBUMIN 2.5* 2.4* 2.3* 2.5*   No results for input(s): LIPASE, AMYLASE in the last 168 hours. No results for input(s): AMMONIA in the last 168 hours.  CBC: Recent Labs  Lab 09/11/20 0352 09/13/20 0609 09/14/20 0718 09/16/20 0436  WBC 3.7* 3.5* 3.6* 5.2  NEUTROABS 1.6*  --   --   --   HGB 16.2 16.1 16.1 16.4  HCT 49.4 51.3 51.1 51.7  MCV 81.5 82.2 82.0 82.2  PLT 165 156 157 168    Cardiac Enzymes: No results for input(s): CKTOTAL, CKMB, CKMBINDEX, TROPONINI in the last 168 hours.  BNP (last 3 results) No results for input(s): BNP in the last 8760 hours.  ProBNP (last 3 results) No results for input(s): PROBNP in the last 8760 hours.  Radiological Exams: No results found.  Assessment/Plan Active Problems:   Acute on chronic respiratory failure with hypoxia (HCC)   Obstructive sleep apnea   Pickwickian syndrome (HCC)   Oropharyngeal dysphagia   Stage III chronic kidney disease (HCC)   Morbid obesity (HCC)   1. Acute on chronic respiratory failure hypoxia continue with T collar  try to achieve 20 hours today. 2. Obstructive sleep apnea baseline we will continue to follow 3. Pickwickian syndrome stable we will likely need to keep the trach in 4. Oropharyngeal dysphagia no change 5. Stage III chronic kidney disease at baseline 6. Morbid obesity dietary management   I have personally seen and evaluated the patient, evaluated laboratory and imaging results, formulated the assessment and plan and placed orders. The Patient requires high complexity decision making with multiple systems involvement.  Rounds were done with the Respiratory Therapy Director and Staff therapists and discussed with nursing staff also.  Yevonne Pax, MD [facility] Pulmonary Critical Care Medicine Sleep Medicine

## 2020-09-18 DIAGNOSIS — R1312 Dysphagia, oropharyngeal phase: Secondary | ICD-10-CM | POA: Diagnosis not present

## 2020-09-18 DIAGNOSIS — G4733 Obstructive sleep apnea (adult) (pediatric): Secondary | ICD-10-CM | POA: Diagnosis not present

## 2020-09-18 DIAGNOSIS — J9621 Acute and chronic respiratory failure with hypoxia: Secondary | ICD-10-CM | POA: Diagnosis not present

## 2020-09-18 NOTE — Progress Notes (Signed)
Pulmonary Critical Care Medicine [facility] [place]   PULMONARY CRITICAL CARE SERVICE  PROGRESS NOTE  Date of Service: 09/18/2020  George Pennington  ZOX:[id number]  DOB: 05-23-1976   DOA: 09/10/2020  Referring Physician: Carron Curie, MD  HPI: George Pennington is a 44 y.o. male seen for follow up of Acute on Chronic Respiratory Failure.  Patient is on T collar currently on 40% FiO2.  Apparently overnight was placed back on the ventilator unknown reason  Medications: Reviewed on Rounds  Physical Exam:  Vitals: Temperature is 98.2 pulse 58 respiratory rate 17 blood pressure is 127/70 saturations 98%  Ventilator Settings right now is on T collar with an FiO2 40%   General: Comfortable at this time  Eyes: Grossly normal lids, irises & conjunctiva  ENT: grossly tongue is normal  Neck: no obvious mass  Cardiovascular: S1 S2 normal no gallop  Respiratory: No rhonchi no rales noted at this time  Abdomen: soft  Skin: no rash seen on limited exam  Musculoskeletal: not rigid  Psychiatric:unable to assess  Neurologic: no seizure no involuntary movements         Lab Data:   Basic Metabolic Panel: Recent Labs  Lab 09/12/20 0359 09/13/20 0609 09/14/20 0718 09/16/20 0436 09/17/20 0401  NA 136 135 136 135 135  K 3.8 3.6 3.7 3.5 3.6  CL 95* 95* 96* 99 98  CO2 32 29 29 26 27   GLUCOSE 95 82 109* 106* 102*  BUN <5* <5* 5* 11 8  CREATININE 1.05 1.16 1.15 1.22 1.15  CALCIUM 8.8* 8.6* 8.7* 8.8* 8.6*  MG 1.8 1.5* 1.9 1.6* 1.8  PHOS 3.3  --  3.3 2.6  --     ABG: Recent Labs  Lab 09/12/20 0655 09/13/20 0600  PHART 7.423 7.432  PCO2ART 53.7* 49.2*  PO2ART 69.6* 65.4*  HCO3 34.6* 32.3*  O2SAT 93.5 91.8    Liver Function Tests: Recent Labs  Lab 09/12/20 0359 09/14/20 0718 09/16/20 0436  ALBUMIN 2.4* 2.3* 2.5*   No results for input(s): LIPASE, AMYLASE in the last 168 hours. No results for input(s): AMMONIA in the last 168 hours.  CBC: Recent  Labs  Lab 09/13/20 0609 09/14/20 0718 09/16/20 0436  WBC 3.5* 3.6* 5.2  HGB 16.1 16.1 16.4  HCT 51.3 51.1 51.7  MCV 82.2 82.0 82.2  PLT 156 157 168    Cardiac Enzymes: No results for input(s): CKTOTAL, CKMB, CKMBINDEX, TROPONINI in the last 168 hours.  BNP (last 3 results) No results for input(s): BNP in the last 8760 hours.  ProBNP (last 3 results) No results for input(s): PROBNP in the last 8760 hours.  Radiological Exams: No results found.  Assessment/Plan Active Problems:   Acute on chronic respiratory failure with hypoxia (HCC)   Obstructive sleep apnea   Pickwickian syndrome (HCC)   Oropharyngeal dysphagia   Stage III chronic kidney disease (HCC)   Morbid obesity (HCC)   1. Acute on chronic respiratory failure with hypoxia.  Will titrate oxygen and continue with T collar. 2. Obstructive sleep apnea pickwickian syndrome at baseline we will keep tracheostomy in place 3. Oropharyngeal dysphagia working with speech therapy 4. Stage III chronic kidney disease monitoring labs 5. Super morbid obesity dietary management   I have personally seen and evaluated the patient, evaluated laboratory and imaging results, formulated the assessment and plan and placed orders. The Patient requires high complexity decision making with multiple systems involvement.  Rounds were done with the Respiratory Therapy Director and Staff therapists and discussed  with nursing staff also.  Allyne Gee, MD [facility] Pulmonary Critical Care Medicine Sleep Medicine

## 2020-09-19 DIAGNOSIS — R1312 Dysphagia, oropharyngeal phase: Secondary | ICD-10-CM | POA: Diagnosis not present

## 2020-09-19 DIAGNOSIS — G4733 Obstructive sleep apnea (adult) (pediatric): Secondary | ICD-10-CM | POA: Diagnosis not present

## 2020-09-19 DIAGNOSIS — J9621 Acute and chronic respiratory failure with hypoxia: Secondary | ICD-10-CM | POA: Diagnosis not present

## 2020-09-19 LAB — BLOOD GAS, ARTERIAL
Acid-Base Excess: 3.9 mmol/L — ABNORMAL HIGH (ref 0.0–2.0)
Bicarbonate: 27.8 mmol/L (ref 20.0–28.0)
FIO2: 40
O2 Saturation: 94.4 %
Patient temperature: 36.8
pCO2 arterial: 41 mmHg (ref 32.0–48.0)
pH, Arterial: 7.445 (ref 7.350–7.450)
pO2, Arterial: 72.9 mmHg — ABNORMAL LOW (ref 83.0–108.0)

## 2020-09-19 NOTE — Progress Notes (Signed)
Pulmonary Critical Care Medicine [facility] [place]   PULMONARY CRITICAL CARE SERVICE  PROGRESS NOTE  Date of Service: 09/19/2020  George Pennington  NID:[id number]  DOB: [birth year]   DOA: 09/10/2020  Referring Physician: Carron Curie, MD  HPI: George Pennington is a 44 y.o. male seen for follow up of Acute on Chronic Respiratory Failure.  Patient currently is on T collar has been on 40% FiO2 goal is for 24 hours  Medications: Reviewed on Rounds  Physical Exam:  Vitals: Temperature is 97.5 pulse 69 respiratory 26 blood pressure is 109/63 saturations 97%  Ventilator Settings on T collar with an FiO2 of 40%  . General: Comfortable at this time . Eyes: Grossly normal lids, irises & conjunctiva . ENT: grossly tongue is normal . Neck: no obvious mass . Cardiovascular: S1 S2 normal no gallop . Respiratory: No rhonchi very coarse breath sounds . Abdomen: soft . Skin: no rash seen on limited exam . Musculoskeletal: not rigid . Psychiatric:unable to assess . Neurologic: no seizure no involuntary movements         Lab Data:   Basic Metabolic Panel: Recent Labs  Lab 09/13/20 0609 09/14/20 0718 09/16/20 0436 09/17/20 0401  NA 135 136 135 135  K 3.6 3.7 3.5 3.6  CL 95* 96* 99 98  CO2 29 29 26 27   GLUCOSE 82 109* 106* 102*  BUN <5* 5* 11 8  CREATININE 1.16 1.15 1.22 1.15  CALCIUM 8.6* 8.7* 8.8* 8.6*  MG 1.5* 1.9 1.6* 1.8  PHOS  --  3.3 2.6  --     ABG: Recent Labs  Lab 09/13/20 0600 09/19/20 0758  PHART 7.432 7.445  PCO2ART 49.2* 41.0  PO2ART 65.4* 72.9*  HCO3 32.3* 27.8  O2SAT 91.8 94.4    Liver Function Tests: Recent Labs  Lab 09/14/20 0718 09/16/20 0436  ALBUMIN 2.3* 2.5*   No results for input(s): LIPASE, AMYLASE in the last 168 hours. No results for input(s): AMMONIA in the last 168 hours.  CBC: Recent Labs  Lab 09/13/20 0609 09/14/20 0718 09/16/20 0436  WBC 3.5* 3.6* 5.2  HGB 16.1 16.1 16.4  HCT 51.3 51.1 51.7  MCV 82.2 82.0  82.2  PLT 156 157 168    Cardiac Enzymes: No results for input(s): CKTOTAL, CKMB, CKMBINDEX, TROPONINI in the last 168 hours.  BNP (last 3 results) No results for input(s): BNP in the last 8760 hours.  ProBNP (last 3 results) No results for input(s): PROBNP in the last 8760 hours.  Radiological Exams: No results found.  Assessment/Plan Active Problems:   Acute on chronic respiratory failure with hypoxia (HCC)   Obstructive sleep apnea   Pickwickian syndrome (HCC)   Oropharyngeal dysphagia   Stage III chronic kidney disease (HCC)   Morbid obesity (HCC)   1. Acute on chronic respiratory failure hypoxia we will continue with on T collar titrate oxygen continue plan 2. Obstructive sleep apnea with pickwickian syndrome will need to keep tracheostomy in place 3. Oropharyngeal dysphagia no change supportive care 4. Stage III chronic kidney disease labs are stable 5. Morbid obesity dietary managed   I have personally seen and evaluated the patient, evaluated laboratory and imaging results, formulated the assessment and plan and placed orders. The Patient requires high complexity decision making with multiple systems involvement.  Rounds were done with the Respiratory Therapy Director and Staff therapists and discussed with nursing staff also.  14/09/21, MD [facility] Pulmonary Critical Care Medicine Sleep Medicine

## 2020-09-20 ENCOUNTER — Other Ambulatory Visit (HOSPITAL_COMMUNITY): Payer: Self-pay

## 2020-09-20 DIAGNOSIS — J9621 Acute and chronic respiratory failure with hypoxia: Secondary | ICD-10-CM | POA: Diagnosis not present

## 2020-09-20 DIAGNOSIS — G4733 Obstructive sleep apnea (adult) (pediatric): Secondary | ICD-10-CM | POA: Diagnosis not present

## 2020-09-20 DIAGNOSIS — R1312 Dysphagia, oropharyngeal phase: Secondary | ICD-10-CM | POA: Diagnosis not present

## 2020-09-20 LAB — BASIC METABOLIC PANEL
Anion gap: 7 (ref 5–15)
BUN: 11 mg/dL (ref 6–20)
CO2: 26 mmol/L (ref 22–32)
Calcium: 9 mg/dL (ref 8.9–10.3)
Chloride: 100 mmol/L (ref 98–111)
Creatinine, Ser: 1.19 mg/dL (ref 0.61–1.24)
GFR, Estimated: >60 mL/min (ref >60)
Glucose, Bld: 119 mg/dL — ABNORMAL HIGH (ref 70–99)
Potassium: 4 mmol/L (ref 3.5–5.1)
Sodium: 133 mmol/L — ABNORMAL LOW (ref 135–145)

## 2020-09-20 LAB — CBC
HCT: 50.6 % (ref 39.0–52.0)
Hemoglobin: 16.7 g/dL (ref 13.0–17.0)
MCH: 26.3 pg (ref 26.0–34.0)
MCHC: 33 g/dL (ref 30.0–36.0)
MCV: 79.8 fL — ABNORMAL LOW (ref 80.0–100.0)
Platelets: 156 10*3/uL (ref 150–400)
RBC: 6.34 MIL/uL — ABNORMAL HIGH (ref 4.22–5.81)
RDW: 19 % — ABNORMAL HIGH (ref 11.5–15.5)
WBC: 4.2 10*3/uL (ref 4.0–10.5)
nRBC: 0 % (ref 0.0–0.2)

## 2020-09-20 LAB — MAGNESIUM: Magnesium: 1.8 mg/dL (ref 1.7–2.4)

## 2020-09-20 LAB — VANCOMYCIN, TROUGH
Vancomycin Tr: 40 ug/mL (ref 15–20)
Vancomycin Tr: 17 ug/mL (ref 15–20)

## 2020-09-20 LAB — PHOSPHORUS: Phosphorus: 3.8 mg/dL (ref 2.5–4.6)

## 2020-09-20 NOTE — Progress Notes (Signed)
Pulmonary Critical Care Medicine [facility] [place]   PULMONARY CRITICAL CARE SERVICE  PROGRESS NOTE  Date of Service: 09/20/2020  Trooper Olander  EXH:[id number]  DOB: 1976-01-18   DOA: 09/10/2020  Referring Physician: Carron Curie, MD  HPI: George Pennington is a 44 y.o. male seen for follow up of Acute on Chronic Respiratory Failure.  Patient remains on T collar has been on 40% FiO2 good saturations are noted  Medications: Reviewed on Rounds  Physical Exam:  Vitals: Temperature is 98.0 pulse 71 respiratory rate is 18 blood pressure is 108/65 saturations 94%  Ventilator Settings on T collar with an FiO2 of 40%  . General: Comfortable at this time . Eyes: Grossly normal lids, irises & conjunctiva . ENT: grossly tongue is normal . Neck: no obvious mass . Cardiovascular: S1 S2 normal no gallop . Respiratory: Scattered rhonchi no rales . Abdomen: soft . Skin: no rash seen on limited exam . Musculoskeletal: not rigid . Psychiatric:unable to assess . Neurologic: no seizure no involuntary movements         Lab Data:   Basic Metabolic Panel: Recent Labs  Lab 09/14/20 0718 09/16/20 0436 09/17/20 0401 09/20/20 0730  NA 136 135 135 133*  K 3.7 3.5 3.6 4.0  CL 96* 99 98 100  CO2 29 26 27 26   GLUCOSE 109* 106* 102* 119*  BUN 5* 11 8 11   CREATININE 1.15 1.22 1.15 1.19  CALCIUM 8.7* 8.8* 8.6* 9.0  MG 1.9 1.6* 1.8 1.8  PHOS 3.3 2.6  --  3.8    ABG: Recent Labs  Lab 09/19/20 0758  PHART 7.445  PCO2ART 41.0  PO2ART 72.9*  HCO3 27.8  O2SAT 94.4    Liver Function Tests: Recent Labs  Lab 09/14/20 0718 09/16/20 0436  ALBUMIN 2.3* 2.5*   No results for input(s): LIPASE, AMYLASE in the last 168 hours. No results for input(s): AMMONIA in the last 168 hours.  CBC: Recent Labs  Lab 09/14/20 0718 09/16/20 0436 09/20/20 0730  WBC 3.6* 5.2 4.2  HGB 16.1 16.4 16.7  HCT 51.1 51.7 50.6  MCV 82.0 82.2 79.8*  PLT 157 168 156    Cardiac  Enzymes: No results for input(s): CKTOTAL, CKMB, CKMBINDEX, TROPONINI in the last 168 hours.  BNP (last 3 results) No results for input(s): BNP in the last 8760 hours.  ProBNP (last 3 results) No results for input(s): PROBNP in the last 8760 hours.  Radiological Exams: No results found.  Assessment/Plan Active Problems:   Acute on chronic respiratory failure with hypoxia (HCC)   Obstructive sleep apnea   Pickwickian syndrome (HCC)   Oropharyngeal dysphagia   Stage III chronic kidney disease (HCC)   Morbid obesity (HCC)   1. Acute on chronic respiratory failure hypoxia on T collar right now on 40% FiO2 we will continue with pulmonary toilet supportive care patient is at baseline 2. Obstructive sleep apnea with pickwickian syndrome supportive care no plan on decannulation 3. Oropharyngeal dysphagia follow-up with speech 4. Morbid obesity supportive care 5. Stage III chronic kidney disease at baseline   I have personally seen and evaluated the patient, evaluated laboratory and imaging results, formulated the assessment and plan and placed orders. The Patient requires high complexity decision making with multiple systems involvement.  Rounds were done with the Respiratory Therapy Director and Staff therapists and discussed with nursing staff also.  14/09/21, MD [facility] Pulmonary Critical Care Medicine Sleep Medicine

## 2020-09-21 DIAGNOSIS — G4733 Obstructive sleep apnea (adult) (pediatric): Secondary | ICD-10-CM | POA: Diagnosis not present

## 2020-09-21 DIAGNOSIS — R1312 Dysphagia, oropharyngeal phase: Secondary | ICD-10-CM | POA: Diagnosis not present

## 2020-09-21 DIAGNOSIS — J9621 Acute and chronic respiratory failure with hypoxia: Secondary | ICD-10-CM | POA: Diagnosis not present

## 2020-09-21 NOTE — Progress Notes (Signed)
Pulmonary Critical Care Medicine [facility] [place]   PULMONARY CRITICAL CARE SERVICE  PROGRESS NOTE  Date of Service: 09/21/2020  George Pennington  KZS:[id number]  DOB: May 10, 1976   DOA: 09/10/2020  Referring Physician: Carron Curie, MD  HPI: George Pennington is a 44 y.o. male seen for follow up of Acute on Chronic Respiratory Failure.  Patient currently is on T collar has been on 40% FiO2 good saturations are noted at this time  Medications: Reviewed on Rounds  Physical Exam:  Vitals: Temperature 98.2 pulse 65 respiratory rate 22 blood pressure is 118/69 saturations 94%  Ventilator Settings on T collar with an FiO2 of 40%  . General: Comfortable at this time . Eyes: Grossly normal lids, irises & conjunctiva . ENT: grossly tongue is normal . Neck: no obvious mass . Cardiovascular: S1 S2 normal no gallop . Respiratory: Coarse breath sounds with a few scattered rhonchi . Abdomen: soft . Skin: no rash seen on limited exam . Musculoskeletal: not rigid . Psychiatric:unable to assess . Neurologic: no seizure no involuntary movements         Lab Data:   Basic Metabolic Panel: Recent Labs  Lab 09/16/20 0436 09/17/20 0401 09/20/20 0730  NA 135 135 133*  K 3.5 3.6 4.0  CL 99 98 100  CO2 26 27 26   GLUCOSE 106* 102* 119*  BUN 11 8 11   CREATININE 1.22 1.15 1.19  CALCIUM 8.8* 8.6* 9.0  MG 1.6* 1.8 1.8  PHOS 2.6  --  3.8    ABG: Recent Labs  Lab 09/19/20 0758  PHART 7.445  PCO2ART 41.0  PO2ART 72.9*  HCO3 27.8  O2SAT 94.4    Liver Function Tests: Recent Labs  Lab 09/16/20 0436  ALBUMIN 2.5*   No results for input(s): LIPASE, AMYLASE in the last 168 hours. No results for input(s): AMMONIA in the last 168 hours.  CBC: Recent Labs  Lab 09/16/20 0436 09/20/20 0730  WBC 5.2 4.2  HGB 16.4 16.7  HCT 51.7 50.6  MCV 82.2 79.8*  PLT 168 156    Cardiac Enzymes: No results for input(s): CKTOTAL, CKMB, CKMBINDEX, TROPONINI in the last 168  hours.  BNP (last 3 results) No results for input(s): BNP in the last 8760 hours.  ProBNP (last 3 results) No results for input(s): PROBNP in the last 8760 hours.  Radiological Exams: No results found.  Assessment/Plan Active Problems:   Acute on chronic respiratory failure with hypoxia (HCC)   Obstructive sleep apnea   Pickwickian syndrome (HCC)   Oropharyngeal dysphagia   Stage III chronic kidney disease (HCC)   Morbid obesity (HCC)   1. Acute on chronic respiratory failure with hypoxia we will continue with the T collar titrate oxygen continue pulmonary toilet. 2. Obstructive sleep apnea pickwickian syndrome patient is controlled we will continue to follow 3. Oropharyngeal dysphagia no change 4. Stage III chronic kidney disease at baseline 5. Morbid obesity dietary management   I have personally seen and evaluated the patient, evaluated laboratory and imaging results, formulated the assessment and plan and placed orders. The Patient requires high complexity decision making with multiple systems involvement.  Rounds were done with the Respiratory Therapy Director and Staff therapists and discussed with nursing staff also.  14/09/21, MD [facility] Pulmonary Critical Care Medicine Sleep Medicine

## 2020-09-22 DIAGNOSIS — G4733 Obstructive sleep apnea (adult) (pediatric): Secondary | ICD-10-CM | POA: Diagnosis not present

## 2020-09-22 DIAGNOSIS — J9621 Acute and chronic respiratory failure with hypoxia: Secondary | ICD-10-CM | POA: Diagnosis not present

## 2020-09-22 DIAGNOSIS — R1312 Dysphagia, oropharyngeal phase: Secondary | ICD-10-CM | POA: Diagnosis not present

## 2020-09-23 DIAGNOSIS — R1312 Dysphagia, oropharyngeal phase: Secondary | ICD-10-CM | POA: Diagnosis not present

## 2020-09-23 DIAGNOSIS — J9621 Acute and chronic respiratory failure with hypoxia: Secondary | ICD-10-CM | POA: Diagnosis not present

## 2020-09-23 DIAGNOSIS — G4733 Obstructive sleep apnea (adult) (pediatric): Secondary | ICD-10-CM | POA: Diagnosis not present

## 2020-09-23 LAB — BASIC METABOLIC PANEL
Anion gap: 9 (ref 5–15)
BUN: 10 mg/dL (ref 6–20)
CO2: 26 mmol/L (ref 22–32)
Calcium: 8.9 mg/dL (ref 8.9–10.3)
Chloride: 100 mmol/L (ref 98–111)
Creatinine, Ser: 1.22 mg/dL (ref 0.61–1.24)
GFR, Estimated: >60 mL/min (ref >60)
Glucose, Bld: 96 mg/dL (ref 70–99)
Potassium: 3.5 mmol/L (ref 3.5–5.1)
Sodium: 135 mmol/L (ref 135–145)

## 2020-09-23 LAB — CBC
HCT: 50.4 % (ref 39.0–52.0)
Hemoglobin: 15.9 g/dL (ref 13.0–17.0)
MCH: 25.6 pg — ABNORMAL LOW (ref 26.0–34.0)
MCHC: 31.5 g/dL (ref 30.0–36.0)
MCV: 81.3 fL (ref 80.0–100.0)
Platelets: 150 10*3/uL (ref 150–400)
RBC: 6.2 MIL/uL — ABNORMAL HIGH (ref 4.22–5.81)
RDW: 19.1 % — ABNORMAL HIGH (ref 11.5–15.5)
WBC: 4.1 10*3/uL (ref 4.0–10.5)
nRBC: 0 % (ref 0.0–0.2)

## 2020-09-23 LAB — MAGNESIUM: Magnesium: 1.9 mg/dL (ref 1.7–2.4)

## 2020-09-23 NOTE — Progress Notes (Signed)
Pulmonary Critical Care Medicine [facility] [place]   PULMONARY CRITICAL CARE SERVICE  PROGRESS NOTE  Date of Service: 09/22/2020  George Pennington  ALP:[id number]  DOB: 14-Mar-1976   DOA: 09/10/2020  Referring Physician: Carron Curie, MD  HPI: George Pennington is a 44 y.o. male seen for follow up of Acute on Chronic Respiratory Failure.  Patient currently is on T collar has been on 35% FiO2 with good saturations.  Medications: Reviewed on Rounds  Physical Exam:  Vitals: Temperature is 98.0 pulse 62 respiratory 17 blood pressures 116/56 saturations 98%  Ventilator Settings off the ventilator on T collar with FiO2 35%  . General: Comfortable at this time . Eyes: Grossly normal lids, irises & conjunctiva . ENT: grossly tongue is normal . Neck: no obvious mass . Cardiovascular: S1 S2 normal no gallop . Respiratory: No rhonchi very coarse breath . Abdomen: soft . Skin: no rash seen on limited exam . Musculoskeletal: not rigid . Psychiatric:unable to assess . Neurologic: no seizure no involuntary movements         Lab Data:   Basic Metabolic Panel: Recent Labs  Lab 09/17/20 0401 09/20/20 0730 09/23/20 0413  NA 135 133* 135  K 3.6 4.0 3.5  CL 98 100 100  CO2 27 26 26   GLUCOSE 102* 119* 96  BUN 8 11 10   CREATININE 1.15 1.19 1.22  CALCIUM 8.6* 9.0 8.9  MG 1.8 1.8 1.9  PHOS  --  3.8  --     ABG: Recent Labs  Lab 09/19/20 0758  PHART 7.445  PCO2ART 41.0  PO2ART 72.9*  HCO3 27.8  O2SAT 94.4    Liver Function Tests: No results for input(s): AST, ALT, ALKPHOS, BILITOT, PROT, ALBUMIN in the last 168 hours. No results for input(s): LIPASE, AMYLASE in the last 168 hours. No results for input(s): AMMONIA in the last 168 hours.  CBC: Recent Labs  Lab 09/20/20 0730 09/23/20 0413  WBC 4.2 4.1  HGB 16.7 15.9  HCT 50.6 50.4  MCV 79.8* 81.3  PLT 156 150    Cardiac Enzymes: No results for input(s): CKTOTAL, CKMB, CKMBINDEX, TROPONINI in the last  168 hours.  BNP (last 3 results) No results for input(s): BNP in the last 8760 hours.  ProBNP (last 3 results) No results for input(s): PROBNP in the last 8760 hours.  Radiological Exams: No results found.  Assessment/Plan Active Problems:   Acute on chronic respiratory failure with hypoxia (HCC)   Obstructive sleep apnea   Pickwickian syndrome (HCC)   Oropharyngeal dysphagia   Stage III chronic kidney disease (HCC)   Morbid obesity (HCC)   1. Acute on chronic respiratory failure with hypoxia we will continue with the wean on T collar drop oxygen to 28% if possible. 2. Obstructive sleep apnea and pickwickian syndrome no change we will continue to follow 3. Oropharyngeal dysphagia at baseline 4. Stage III chronic kidney disease at baseline we will continue to monitor   I have personally seen and evaluated the patient, evaluated laboratory and imaging results, formulated the assessment and plan and placed orders. The Patient requires high complexity decision making with multiple systems involvement.  Rounds were done with the Respiratory Therapy Director and Staff therapists and discussed with nursing staff also.  09/22/20, MD [facility] Pulmonary Critical Care Medicine Sleep Medicine

## 2020-09-23 NOTE — Progress Notes (Signed)
Pulmonary Critical Care Medicine [facility] [place]   PULMONARY CRITICAL CARE SERVICE  PROGRESS NOTE  Date of Service: 09/23/2020  George Pennington  EQA:[id number]  DOB: 1976/05/04   DOA: 09/10/2020  Referring Physician: Carron Curie, MD  HPI: George Pennington is a 44 y.o. male seen for follow up of Acute on Chronic Respiratory Failure.  Patient currently is on T collar has been on 35% FiO2 good saturations secretions are still significant  Medications: Reviewed on Rounds  Physical Exam:  Vitals: Temperature is 97.3 pulse 60 respiratory 20 blood pressure is 109/64 saturations 98%  Ventilator Settings on T collar with an FiO2 35%  . General: Comfortable at this time . Eyes: Grossly normal lids, irises & conjunctiva . ENT: grossly tongue is normal . Neck: no obvious mass . Cardiovascular: S1 S2 normal no gallop . Respiratory: No rhonchi no rales noted at this time . Abdomen: soft . Skin: no rash seen on limited exam . Musculoskeletal: not rigid . Psychiatric:unable to assess . Neurologic: no seizure no involuntary movements         Lab Data:   Basic Metabolic Panel: Recent Labs  Lab 09/17/20 0401 09/20/20 0730 09/23/20 0413  NA 135 133* 135  K 3.6 4.0 3.5  CL 98 100 100  CO2 27 26 26   GLUCOSE 102* 119* 96  BUN 8 11 10   CREATININE 1.15 1.19 1.22  CALCIUM 8.6* 9.0 8.9  MG 1.8 1.8 1.9  PHOS  --  3.8  --     ABG: Recent Labs  Lab 09/19/20 0758  PHART 7.445  PCO2ART 41.0  PO2ART 72.9*  HCO3 27.8  O2SAT 94.4    Liver Function Tests: No results for input(s): AST, ALT, ALKPHOS, BILITOT, PROT, ALBUMIN in the last 168 hours. No results for input(s): LIPASE, AMYLASE in the last 168 hours. No results for input(s): AMMONIA in the last 168 hours.  CBC: Recent Labs  Lab 09/20/20 0730 09/23/20 0413  WBC 4.2 4.1  HGB 16.7 15.9  HCT 50.6 50.4  MCV 79.8* 81.3  PLT 156 150    Cardiac Enzymes: No results for input(s): CKTOTAL, CKMB, CKMBINDEX,  TROPONINI in the last 168 hours.  BNP (last 3 results) No results for input(s): BNP in the last 8760 hours.  ProBNP (last 3 results) No results for input(s): PROBNP in the last 8760 hours.  Radiological Exams: No results found.  Assessment/Plan Active Problems:   Acute on chronic respiratory failure with hypoxia (HCC)   Obstructive sleep apnea   Pickwickian syndrome (HCC)   Oropharyngeal dysphagia   Stage III chronic kidney disease (HCC)   Morbid obesity (HCC)   1. Acute on chronic respiratory failure hypoxia patient remains on T collar 35% FiO2 discussed on rounds not to be decannulated 2. Obstructive sleep apnea pickwickian syndrome supportive care monitor saturations 3. Oropharyngeal dysphagia Follow speech therapy recommendation 4. Stage III chronic kidney disease at baseline continue to follow 5. Morbid obesity dietary manage   I have personally seen and evaluated the patient, evaluated laboratory and imaging results, formulated the assessment and plan and placed orders. The Patient requires high complexity decision making with multiple systems involvement.  Rounds were done with the Respiratory Therapy Director and Staff therapists and discussed with nursing staff also.  09/22/20, MD [facility] Pulmonary Critical Care Medicine Sleep Medicine

## 2020-09-24 DIAGNOSIS — G4733 Obstructive sleep apnea (adult) (pediatric): Secondary | ICD-10-CM | POA: Diagnosis not present

## 2020-09-24 DIAGNOSIS — R1312 Dysphagia, oropharyngeal phase: Secondary | ICD-10-CM | POA: Diagnosis not present

## 2020-09-24 DIAGNOSIS — J9621 Acute and chronic respiratory failure with hypoxia: Secondary | ICD-10-CM | POA: Diagnosis not present

## 2020-09-24 NOTE — Progress Notes (Signed)
Pulmonary Critical Care Medicine [facility] [place]   PULMONARY CRITICAL CARE SERVICE  PROGRESS NOTE  Date of Service: 09/24/2020  George Pennington  [id number]:[id number]  DOB: 03/07/1976   DOA: 09/10/2020  Referring Physician: Carron Curie, MD  HPI: George Pennington is a 44 y.o. male seen for follow up of Acute on Chronic Respiratory Failure.  Patient currently is on T collar appears to be doing fine at baseline.  Secretions are still significant however  Medications: Reviewed on Rounds  Physical Exam:  Vitals: Temperature 97.8 pulse 70 respiratory rate is 15 blood pressure is 117/57 saturations 100%  Ventilator Settings off the ventilator on T collar 35% FiO2  . General: Comfortable at this time . Eyes: Grossly normal lids, irises & conjunctiva . ENT: grossly tongue is normal . Neck: no obvious mass . Cardiovascular: S1 S2 normal no gallop . Respiratory: No rhonchi very coarse breath sound . Abdomen: soft . Skin: no rash seen on limited exam . Musculoskeletal: not rigid . Psychiatric:unable to assess . Neurologic: no seizure no involuntary movements         Lab Data:   Basic Metabolic Panel: Recent Labs  Lab 09/20/20 0730 09/23/20 0413  NA 133* 135  K 4.0 3.5  CL 100 100  CO2 26 26  GLUCOSE 119* 96  BUN 11 10  CREATININE 1.19 1.22  CALCIUM 9.0 8.9  MG 1.8 1.9  PHOS 3.8  --     ABG: Recent Labs  Lab 09/19/20 0758  PHART 7.445  PCO2ART 41.0  PO2ART 72.9*  HCO3 27.8  O2SAT 94.4    Liver Function Tests: No results for input(s): AST, ALT, ALKPHOS, BILITOT, PROT, ALBUMIN in the last 168 hours. No results for input(s): LIPASE, AMYLASE in the last 168 hours. No results for input(s): AMMONIA in the last 168 hours.  CBC: Recent Labs  Lab 09/20/20 0730 09/23/20 0413  WBC 4.2 4.1  HGB 16.7 15.9  HCT 50.6 50.4  MCV 79.8* 81.3  PLT 156 150    Cardiac Enzymes: No results for input(s): CKTOTAL, CKMB, CKMBINDEX, TROPONINI in the last 168  hours.  BNP (last 3 results) No results for input(s): BNP in the last 8760 hours.  ProBNP (last 3 results) No results for input(s): PROBNP in the last 8760 hours.  Radiological Exams: No results found.  Assessment/Plan Active Problems:   Acute on chronic respiratory failure with hypoxia (HCC)   Obstructive sleep apnea   Pickwickian syndrome (HCC)   Oropharyngeal dysphagia   Stage III chronic kidney disease (HCC)   Morbid obesity (HCC)   1. Acute on chronic respiratory failure hypoxia we will continue with the T collar titrate oxygen continue pulmonary toilet. 2. Obstructive sleep apnea pickwickian syndrome we will going to change the trach out to a #6 trach respiratory therapy would like to make an attempt at doing capping and using the BiPAP.  It is likely that he may not tolerate however we will give him a trial 3. Oropharyngeal dysphagia no change continue to follow 4. Stage III chronic kidney disease supportive care 5. Morbid obesity at baseline   I have personally seen and evaluated the patient, evaluated laboratory and imaging results, formulated the assessment and plan and placed orders. The Patient requires high complexity decision making with multiple systems involvement.  Rounds were done with the Respiratory Therapy Director and Staff therapists and discussed with nursing staff also.  Yevonne Pax, MD [facility] Pulmonary Critical Care Medicine Sleep Medicine

## 2020-09-25 DIAGNOSIS — J9621 Acute and chronic respiratory failure with hypoxia: Secondary | ICD-10-CM | POA: Diagnosis not present

## 2020-09-25 DIAGNOSIS — G4733 Obstructive sleep apnea (adult) (pediatric): Secondary | ICD-10-CM | POA: Diagnosis not present

## 2020-09-25 DIAGNOSIS — R1312 Dysphagia, oropharyngeal phase: Secondary | ICD-10-CM | POA: Diagnosis not present

## 2020-09-25 NOTE — Progress Notes (Signed)
Pulmonary Critical Care Medicine [facility] [place]   PULMONARY CRITICAL CARE SERVICE  PROGRESS NOTE  Date of Service: 09/25/2020  George Pennington  [id number]  DOB: [birth year]   DOA: 09/10/2020  Referring Physician: Carron Curie, MD  HPI: George Pennington is a 44 y.o. male seen for follow up of Acute on Chronic Respiratory Failure.  Patient is capping currently requiring 2 L of oxygen.  Overnight respiratory reports that the patient was able to use the BiPAP without any issues.  He appears to be compliant  Medications: Reviewed on Rounds  Physical Exam:  Vitals: Temperature is 96.8 pulse 72 respiratory 22 blood pressure is 126/73 saturations 100%  Ventilator Settings capping on 2 L of oxygen  . General: Comfortable at this time . Eyes: Grossly normal lids, irises & conjunctiva . ENT: grossly tongue is normal . Neck: no obvious mass . Cardiovascular: S1 S2 normal no gallop . Respiratory: No rhonchi very coarse breath sounds . Abdomen: soft . Skin: no rash seen on limited exam . Musculoskeletal: not rigid . Psychiatric:unable to assess . Neurologic: no seizure no involuntary movements         Lab Data:   Basic Metabolic Panel: Recent Labs  Lab 09/20/20 0730 09/23/20 0413  NA 133* 135  K 4.0 3.5  CL 100 100  CO2 26 26  GLUCOSE 119* 96  BUN 11 10  CREATININE 1.19 1.22  CALCIUM 9.0 8.9  MG 1.8 1.9  PHOS 3.8  --     ABG: Recent Labs  Lab 09/19/20 0758  PHART 7.445  PCO2ART 41.0  PO2ART 72.9*  HCO3 27.8  O2SAT 94.4    Liver Function Tests: No results for input(s): AST, ALT, ALKPHOS, BILITOT, PROT, ALBUMIN in the last 168 hours. No results for input(s): LIPASE, AMYLASE in the last 168 hours. No results for input(s): AMMONIA in the last 168 hours.  CBC: Recent Labs  Lab 09/20/20 0730 09/23/20 0413  WBC 4.2 4.1  HGB 16.7 15.9  HCT 50.6 50.4  MCV 79.8* 81.3  PLT 156 150    Cardiac Enzymes: No results for input(s): CKTOTAL,  CKMB, CKMBINDEX, TROPONINI in the last 168 hours.  BNP (last 3 results) No results for input(s): BNP in the last 8760 hours.  ProBNP (last 3 results) No results for input(s): PROBNP in the last 8760 hours.  Radiological Exams: No results found.  Assessment/Plan Active Problems:   Acute on chronic respiratory failure with hypoxia (HCC)   Obstructive sleep apnea   Pickwickian syndrome (HCC)   Oropharyngeal dysphagia   Stage III chronic kidney disease (HCC)   Morbid obesity (HCC)   1. Acute on chronic respiratory failure hypoxia we will continue with capping trials currently on 2 L of O2 2. Obstructive sleep apnea no change pickwickian syndrome patient is tolerating the BiPAP and appears to be doing well after the respiratory therapist had a long conversation with him. 3. Oropharyngeal dysphagia doing better with eating 4. Stage III chronic kidney disease following labs 5. Morbid obesity dietary management   I have personally seen and evaluated the patient, evaluated laboratory and imaging results, formulated the assessment and plan and placed orders. The Patient requires high complexity decision making with multiple systems involvement.  Rounds were done with the Respiratory Therapy Director and Staff therapists and discussed with nursing staff also.  Yevonne Pax, MD [facility] Pulmonary Critical Care Medicine Sleep Medicine

## 2020-09-26 DIAGNOSIS — R1312 Dysphagia, oropharyngeal phase: Secondary | ICD-10-CM | POA: Diagnosis not present

## 2020-09-26 DIAGNOSIS — J9621 Acute and chronic respiratory failure with hypoxia: Secondary | ICD-10-CM | POA: Diagnosis not present

## 2020-09-26 DIAGNOSIS — G4733 Obstructive sleep apnea (adult) (pediatric): Secondary | ICD-10-CM | POA: Diagnosis not present

## 2020-09-26 NOTE — Progress Notes (Signed)
Pulmonary Critical Care Medicine [facility] [place]   PULMONARY CRITICAL CARE SERVICE  PROGRESS NOTE  Date of Service: 09/26/2020  George Pennington  OJJ:[id number]  DOB: 11/08/1975   DOA: 09/10/2020  Referring Physician: Carron Curie, MD  HPI: George Pennington is a 44 y.o. male seen for follow up of Acute on Chronic Respiratory Failure.  Patient is capping has been on 2 L good saturations are noted  Medications: Reviewed on Rounds  Physical Exam:  Vitals: Temperature is 96.3 pulse 69 respiratory rate 26 blood pressure is 90/56 saturations 96%  Ventilator Settings capping on 2 L of oxygen  . General: Comfortable at this time . Eyes: Grossly normal lids, irises & conjunctiva . ENT: grossly tongue is normal . Neck: no obvious mass . Cardiovascular: S1 S2 normal no gallop . Respiratory: No rhonchi very coarse breath sounds . Abdomen: soft . Skin: no rash seen on limited exam . Musculoskeletal: not rigid . Psychiatric:unable to assess . Neurologic: no seizure no involuntary movements         Lab Data:   Basic Metabolic Panel: Recent Labs  Lab 09/20/20 0730 09/23/20 0413  NA 133* 135  K 4.0 3.5  CL 100 100  CO2 26 26  GLUCOSE 119* 96  BUN 11 10  CREATININE 1.19 1.22  CALCIUM 9.0 8.9  MG 1.8 1.9  PHOS 3.8  --     ABG: No results for input(s): PHART, PCO2ART, PO2ART, HCO3, O2SAT in the last 168 hours.  Liver Function Tests: No results for input(s): AST, ALT, ALKPHOS, BILITOT, PROT, ALBUMIN in the last 168 hours. No results for input(s): LIPASE, AMYLASE in the last 168 hours. No results for input(s): AMMONIA in the last 168 hours.  CBC: Recent Labs  Lab 09/20/20 0730 09/23/20 0413  WBC 4.2 4.1  HGB 16.7 15.9  HCT 50.6 50.4  MCV 79.8* 81.3  PLT 156 150    Cardiac Enzymes: No results for input(s): CKTOTAL, CKMB, CKMBINDEX, TROPONINI in the last 168 hours.  BNP (last 3 results) No results for input(s): BNP in the last 8760  hours.  ProBNP (last 3 results) No results for input(s): PROBNP in the last 8760 hours.  Radiological Exams: No results found.  Assessment/Plan Active Problems:   Acute on chronic respiratory failure with hypoxia (HCC)   Obstructive sleep apnea   Pickwickian syndrome (HCC)   Oropharyngeal dysphagia   Stage III chronic kidney disease (HCC)   Morbid obesity (HCC)   1. Acute on chronic respiratory failure hypoxia we will continue with capping trials currently on 2 L O2 patient continues to be compliant with BiPAP at nighttime. 2. Obstructive sleep apnea with pickwickian syndrome patient is compliant with BiPAP encourage continued compliance 3. Oropharyngeal dysphagia he is eating doing well 4. Stage III kidney disease monitoring labs 5. Morbid obesity dietary managed   I have personally seen and evaluated the patient, evaluated laboratory and imaging results, formulated the assessment and plan and placed orders. The Patient requires high complexity decision making with multiple systems involvement.  Rounds were done with the Respiratory Therapy Director and Staff therapists and discussed with nursing staff also.  Yevonne Pax, MD [facility] Pulmonary Critical Care Medicine Sleep Medicine

## 2020-09-27 DIAGNOSIS — G4733 Obstructive sleep apnea (adult) (pediatric): Secondary | ICD-10-CM | POA: Diagnosis not present

## 2020-09-27 DIAGNOSIS — R1312 Dysphagia, oropharyngeal phase: Secondary | ICD-10-CM | POA: Diagnosis not present

## 2020-09-27 DIAGNOSIS — J9621 Acute and chronic respiratory failure with hypoxia: Secondary | ICD-10-CM | POA: Diagnosis not present

## 2020-09-27 LAB — BASIC METABOLIC PANEL
Anion gap: 10 (ref 5–15)
BUN: 11 mg/dL (ref 6–20)
CO2: 25 mmol/L (ref 22–32)
Calcium: 8.9 mg/dL (ref 8.9–10.3)
Chloride: 99 mmol/L (ref 98–111)
Creatinine, Ser: 1.2 mg/dL (ref 0.61–1.24)
GFR, Estimated: >60 mL/min (ref >60)
Glucose, Bld: 83 mg/dL (ref 70–99)
Potassium: 3.9 mmol/L (ref 3.5–5.1)
Sodium: 134 mmol/L — ABNORMAL LOW (ref 135–145)

## 2020-09-27 LAB — CBC
HCT: 51.6 % (ref 39.0–52.0)
Hemoglobin: 16.4 g/dL (ref 13.0–17.0)
MCH: 25.7 pg — ABNORMAL LOW (ref 26.0–34.0)
MCHC: 31.8 g/dL (ref 30.0–36.0)
MCV: 81 fL (ref 80.0–100.0)
Platelets: 152 10*3/uL (ref 150–400)
RBC: 6.37 MIL/uL — ABNORMAL HIGH (ref 4.22–5.81)
RDW: 19.3 % — ABNORMAL HIGH (ref 11.5–15.5)
WBC: 4.9 10*3/uL (ref 4.0–10.5)
nRBC: 0 % (ref 0.0–0.2)

## 2020-09-27 LAB — MAGNESIUM: Magnesium: 1.8 mg/dL (ref 1.7–2.4)

## 2020-09-27 NOTE — Progress Notes (Signed)
Pulmonary Critical Care Medicine [facility] [place]   PULMONARY CRITICAL CARE SERVICE  PROGRESS NOTE  Date of Service: 09/27/2020  Zigmond Trela  [id number]  DOB: 1975/10/22   DOA: 09/10/2020  Referring Physician: Carron Curie, MD  HPI: George Pennington is a 44 y.o. male seen for follow up of Acute on Chronic Respiratory Failure.  Patient is capping currently on 2 L of oxygen good saturations are noted  Medications: Reviewed on Rounds  Physical Exam:  Vitals: Temperature is 97.2 pulse 69 respiratory 35 blood pressure is 113/73 saturations 96%  Ventilator Settings capping on 2 L  . General: Comfortable at this time . Eyes: Grossly normal lids, irises & conjunctiva . ENT: grossly tongue is normal . Neck: no obvious mass . Cardiovascular: S1 S2 normal no gallop . Respiratory: Scattered rhonchi expansion is equal . Abdomen: soft . Skin: no rash seen on limited exam . Musculoskeletal: not rigid . Psychiatric:unable to assess . Neurologic: no seizure no involuntary movements         Lab Data:   Basic Metabolic Panel: Recent Labs  Lab 09/23/20 0413 09/27/20 0507  NA 135 134*  K 3.5 3.9  CL 100 99  CO2 26 25  GLUCOSE 96 83  BUN 10 11  CREATININE 1.22 1.20  CALCIUM 8.9 8.9  MG 1.9 1.8    ABG: No results for input(s): PHART, PCO2ART, PO2ART, HCO3, O2SAT in the last 168 hours.  Liver Function Tests: No results for input(s): AST, ALT, ALKPHOS, BILITOT, PROT, ALBUMIN in the last 168 hours. No results for input(s): LIPASE, AMYLASE in the last 168 hours. No results for input(s): AMMONIA in the last 168 hours.  CBC: Recent Labs  Lab 09/23/20 0413 09/27/20 0507  WBC 4.1 4.9  HGB 15.9 16.4  HCT 50.4 51.6  MCV 81.3 81.0  PLT 150 152    Cardiac Enzymes: No results for input(s): CKTOTAL, CKMB, CKMBINDEX, TROPONINI in the last 168 hours.  BNP (last 3 results) No results for input(s): BNP in the last 8760 hours.  ProBNP (last 3 results) No  results for input(s): PROBNP in the last 8760 hours.  Radiological Exams: No results found.  Assessment/Plan Active Problems:   Acute on chronic respiratory failure with hypoxia (HCC)   Obstructive sleep apnea   Pickwickian syndrome (HCC)   Oropharyngeal dysphagia   Stage III chronic kidney disease (HCC)   Morbid obesity (HCC)   1. Acute on chronic respiratory failure hypoxia we will continue with capping trials he is using BiPAP at nighttime no plan on decannulation right now 2. Obstructive sleep apnea pickwickian syndrome using BiPAP we will continue 3. Oropharyngeal dysphagia no change doing fine with eating right now 4. Stage III chronic kidney disease supportive care 5. Morbid obesity dietary management   I have personally seen and evaluated the patient, evaluated laboratory and imaging results, formulated the assessment and plan and placed orders. The Patient requires high complexity decision making with multiple systems involvement.  Rounds were done with the Respiratory Therapy Director and Staff therapists and discussed with nursing staff also.  Yevonne Pax, MD [facility] Pulmonary Critical Care Medicine Sleep Medicine

## 2020-09-28 DIAGNOSIS — G4733 Obstructive sleep apnea (adult) (pediatric): Secondary | ICD-10-CM | POA: Diagnosis not present

## 2020-09-28 DIAGNOSIS — R1312 Dysphagia, oropharyngeal phase: Secondary | ICD-10-CM | POA: Diagnosis not present

## 2020-09-28 DIAGNOSIS — J9621 Acute and chronic respiratory failure with hypoxia: Secondary | ICD-10-CM | POA: Diagnosis not present

## 2020-09-28 NOTE — Progress Notes (Addendum)
Pulmonary Critical Care Medicine [facility] [place]   PULMONARY CRITICAL CARE SERVICE  PROGRESS NOTE  Date of Service: 09/28/2020  Rehman Levinson  [id number]  DOB: 1976-04-02   DOA: 09/10/2020  Referring Physician: Carron Curie, MD  HPI: George Pennington is a 44 y.o. male seen for follow up of Acute on Chronic Respiratory Failure.  Apparently last night patient was not using the BiPAP states he is having some issues with the mask.  He is still capping is on 2 L right now  Medications: Reviewed on Rounds  Physical Exam:  Vitals: Temperature is 97.9 pulse 72 respiratory rate 30 blood pressure is 109/57 saturation 96%  Ventilator Settings capping on 2 L oxygen  . General: Comfortable at this time . Eyes: Grossly normal lids, irises & conjunctiva . ENT: grossly tongue is normal . Neck: no obvious mass . Cardiovascular: S1 S2 normal no gallop . Respiratory: No rhonchi no rales are noted at this time . Abdomen: soft . Skin: no rash seen on limited exam . Musculoskeletal: not rigid . Psychiatric:unable to assess . Neurologic: no seizure no involuntary movements         Lab Data:   Basic Metabolic Panel: Recent Labs  Lab 09/23/20 0413 09/27/20 0507  NA 135 134*  K 3.5 3.9  CL 100 99  CO2 26 25  GLUCOSE 96 83  BUN 10 11  CREATININE 1.22 1.20  CALCIUM 8.9 8.9  MG 1.9 1.8    ABG: No results for input(s): PHART, PCO2ART, PO2ART, HCO3, O2SAT in the last 168 hours.  Liver Function Tests: No results for input(s): AST, ALT, ALKPHOS, BILITOT, PROT, ALBUMIN in the last 168 hours. No results for input(s): LIPASE, AMYLASE in the last 168 hours. No results for input(s): AMMONIA in the last 168 hours.  CBC: Recent Labs  Lab 09/23/20 0413 09/27/20 0507  WBC 4.1 4.9  HGB 15.9 16.4  HCT 50.4 51.6  MCV 81.3 81.0  PLT 150 152    Cardiac Enzymes: No results for input(s): CKTOTAL, CKMB, CKMBINDEX, TROPONINI in the last 168 hours.  BNP (last 3  results) No results for input(s): BNP in the last 8760 hours.  ProBNP (last 3 results) No results for input(s): PROBNP in the last 8760 hours.  Radiological Exams: No results found.  Assessment/Plan Active Problems:   Acute on chronic respiratory failure with hypoxia (HCC)   Obstructive sleep apnea   Pickwickian syndrome (HCC)   Oropharyngeal dysphagia   Stage III chronic kidney disease (HCC)   Morbid obesity (HCC)   1. Acute on chronic respiratory failure hypoxia we will continue with capping trials 2. Obstructive sleep apnea and pickwickian syndrome encourage compliance with Pap therapy discussed on rounds with respiratory therapy and patient 3. Oropharyngeal dysphagia no change doing fine with meals 4. Stage III chronic kidney disease at baseline 5. Morbid obesity dietary management   I have personally seen and evaluated the patient, evaluated laboratory and imaging results, formulated the assessment and plan and placed orders. The Patient requires high complexity decision making with multiple systems involvement.  Rounds were done with the Respiratory Therapy Director and Staff therapists and discussed with nursing staff also.  Yevonne Pax, MD [facility] Pulmonary Critical Care Medicine Sleep Medicine

## 2020-09-29 DIAGNOSIS — R1312 Dysphagia, oropharyngeal phase: Secondary | ICD-10-CM | POA: Diagnosis not present

## 2020-09-29 DIAGNOSIS — G4733 Obstructive sleep apnea (adult) (pediatric): Secondary | ICD-10-CM | POA: Diagnosis not present

## 2020-09-29 DIAGNOSIS — J9621 Acute and chronic respiratory failure with hypoxia: Secondary | ICD-10-CM | POA: Diagnosis not present

## 2020-09-29 LAB — BLOOD GAS, ARTERIAL
Acid-Base Excess: 3.3 mmol/L — ABNORMAL HIGH (ref 0.0–2.0)
Bicarbonate: 28 mmol/L (ref 20.0–28.0)
FIO2: 28
O2 Saturation: 90.4 %
Patient temperature: 37
pCO2 arterial: 48.3 mmHg — ABNORMAL HIGH (ref 32.0–48.0)
pH, Arterial: 7.381 (ref 7.350–7.450)
pO2, Arterial: 61.8 mmHg — ABNORMAL LOW (ref 83.0–108.0)

## 2020-09-29 NOTE — Progress Notes (Signed)
Pulmonary Critical Care Medicine [facility] [place]   PULMONARY CRITICAL CARE SERVICE  PROGRESS NOTE  Date of Service: 09/29/2020  George Pennington  KZS:[id number]  DOB: [birth year]   DOA: 09/10/2020  Referring Physician: Carron Curie, MD  HPI: George Pennington is a 44 y.o. male seen for follow up of Acute on Chronic Respiratory Failure. Patient is capping currently on nasal cannula has been noncompliant with the CPAP as recommended. 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 General: Comfortable at this time . Eyes: Grossly normal lids, irises & conjunctiva . ENT: grossly tongue is normal . Neck: no obvious mass . Cardiovascular: S1 S2 normal no gallop . Respiratory: No rhonchi very coarse breath sounds . Abdomen: soft . Skin: no rash seen on limited exam . Musculoskeletal: not rigid . Psychiatric:unable to assess . Neurologic: no seizure no involuntary movements         Lab Data:   Basic Metabolic Panel: Recent Labs  Lab 09/23/20 0413 09/27/20 0507  NA 135 134*  K 3.5 3.9  CL 100 99  CO2 26 25  GLUCOSE 96 83  BUN 10 11  CREATININE 1.22 1.20  CALCIUM 8.9 8.9  MG 1.9 1.8    ABG: No results for input(s): PHART, PCO2ART, PO2ART, HCO3, O2SAT in the last 168 hours.  Liver Function Tests: No results for input(s): AST, ALT, ALKPHOS, BILITOT, PROT, ALBUMIN in the last 168 hours. No results for input(s): LIPASE, AMYLASE in the last 168 hours. No results for input(s): AMMONIA in the last 168 hours.  CBC: Recent Labs  Lab 09/23/20 0413 09/27/20 0507  WBC 4.1 4.9  HGB 15.9 16.4  HCT 50.4 51.6  MCV 81.3 81.0  PLT 150 152    Cardiac Enzymes: No results for input(s): CKTOTAL, CKMB, CKMBINDEX, TROPONINI in the last 168  hours.  BNP (last 3 results) No results for input(s): BNP in the last 8760 hours.  ProBNP (last 3 results) No results for input(s): PROBNP in the last 8760 hours.  Radiological Exams: No results found.  Assessment/Plan Active Problems:   Acute on chronic respiratory failure with hypoxia (HCC)   Obstructive sleep apnea   Pickwickian syndrome (HCC)   Oropharyngeal dysphagia   Stage III chronic kidney disease (HCC)   Morbid obesity (HCC)   1. Acute on chronic respiratory failure hypoxia patient continues with capping trials titrate oxygen continue pulmonary toilet during the daytime and T collar during the nighttime 2. Obstructive sleep apnea pickwickian syndrome will need to keep tracheostomy in place 3. Oropharyngeal dysphagia no change we'll continue supportive care 4. Stage III chronic kidney disease supportive care 5. Morbid obesity based   I have personally seen and evaluated the patient, evaluated laboratory and imaging results, formulated the assessment and plan and placed orders. The Patient requires high complexity decision making with multiple systems involvement.  Rounds were done with the Respiratory Therapy Director and Staff therapists and discussed with nursing staff also.  Yevonne Pax, MD [facility] Pulmonary Critical Care Medicine Sleep Medicine

## 2020-09-30 DIAGNOSIS — R1312 Dysphagia, oropharyngeal phase: Secondary | ICD-10-CM | POA: Diagnosis not present

## 2020-09-30 DIAGNOSIS — J9621 Acute and chronic respiratory failure with hypoxia: Secondary | ICD-10-CM | POA: Diagnosis not present

## 2020-09-30 DIAGNOSIS — G4733 Obstructive sleep apnea (adult) (pediatric): Secondary | ICD-10-CM | POA: Diagnosis not present

## 2020-09-30 NOTE — Progress Notes (Signed)
Pulmonary Critical Care Medicine [facility] [place]   PULMONARY CRITICAL CARE SERVICE  PROGRESS NOTE  Date of Service: 09/30/2020  George Pennington  DQQ:[id number]  DOB: April 08, 1976   DOA: 09/10/2020  Referring Physician: Carron Curie, MD  HPI: George Pennington is a 44 y.o. male seen for follow up of Acute on Chronic Respiratory Failure.  Patient is capping actually doing well so far.  The patient is on capping at nighttime because of noncompliance with PAP therapy  Medications: Reviewed on Rounds  Physical Exam:  Vitals: Temperature is 96.9 pulse 63 respiratory 19 blood pressure is 98/66 saturations 98%  Ventilator Settings on capping trial  . General: Comfortable at this time . Eyes: Grossly normal lids, irises & conjunctiva . ENT: grossly tongue is normal . Neck: no obvious mass . Cardiovascular: S1 S2 normal no gallop . Respiratory: No rhonchi very coarse breath sounds . Abdomen: soft . Skin: no rash seen on limited exam . Musculoskeletal: not rigid . Psychiatric:unable to assess . Neurologic: no seizure no involuntary movements         Lab Data:   Basic Metabolic Panel: Recent Labs  Lab 09/27/20 0507  NA 134*  K 3.9  CL 99  CO2 25  GLUCOSE 83  BUN 11  CREATININE 1.20  CALCIUM 8.9  MG 1.8    ABG: Recent Labs  Lab 09/29/20 0920  PHART 7.381  PCO2ART 48.3*  PO2ART 61.8*  HCO3 28.0  O2SAT 90.4    Liver Function Tests: No results for input(s): AST, ALT, ALKPHOS, BILITOT, PROT, ALBUMIN in the last 168 hours. No results for input(s): LIPASE, AMYLASE in the last 168 hours. No results for input(s): AMMONIA in the last 168 hours.  CBC: Recent Labs  Lab 09/27/20 0507  WBC 4.9  HGB 16.4  HCT 51.6  MCV 81.0  PLT 152    Cardiac Enzymes: No results for input(s): CKTOTAL, CKMB, CKMBINDEX, TROPONINI in the last 168 hours.  BNP (last 3 results) No results for input(s): BNP in the last 8760 hours.  ProBNP (last 3 results) No results  for input(s): PROBNP in the last 8760 hours.  Radiological Exams: No results found.  Assessment/Plan Active Problems:   Acute on chronic respiratory failure with hypoxia (HCC)   Obstructive sleep apnea   Pickwickian syndrome (HCC)   Oropharyngeal dysphagia   Stage III chronic kidney disease (HCC)   Morbid obesity (HCC)   1. Acute on chronic respiratory failure hypoxia we will continue with capping during the daytime and uncapping at nighttime. 2. Obstructive sleep apnea noncompliant with attempted Pap therapy 3. Oropharyngeal dysphagia no change 4. Stage III chronic kidney disease at baseline 5. Morbid obesity no change   I have personally seen and evaluated the patient, evaluated laboratory and imaging results, formulated the assessment and plan and placed orders. The Patient requires high complexity decision making with multiple systems involvement.  Rounds were done with the Respiratory Therapy Director and Staff therapists and discussed with nursing staff also.  Yevonne Pax, MD [facility] Pulmonary Critical Care Medicine Sleep Medicine

## 2020-10-01 DIAGNOSIS — J9621 Acute and chronic respiratory failure with hypoxia: Secondary | ICD-10-CM | POA: Diagnosis not present

## 2020-10-01 DIAGNOSIS — G4733 Obstructive sleep apnea (adult) (pediatric): Secondary | ICD-10-CM | POA: Diagnosis not present

## 2020-10-01 DIAGNOSIS — R1312 Dysphagia, oropharyngeal phase: Secondary | ICD-10-CM | POA: Diagnosis not present

## 2020-10-01 LAB — BASIC METABOLIC PANEL
Anion gap: 8 (ref 5–15)
BUN: 9 mg/dL (ref 6–20)
CO2: 28 mmol/L (ref 22–32)
Calcium: 9.1 mg/dL (ref 8.9–10.3)
Chloride: 100 mmol/L (ref 98–111)
Creatinine, Ser: 1.33 mg/dL — ABNORMAL HIGH (ref 0.61–1.24)
GFR, Estimated: >60 mL/min (ref >60)
Glucose, Bld: 103 mg/dL — ABNORMAL HIGH (ref 70–99)
Potassium: 3.7 mmol/L (ref 3.5–5.1)
Sodium: 136 mmol/L (ref 135–145)

## 2020-10-01 LAB — HEMOGLOBIN A1C
Hgb A1c MFr Bld: 6.8 % — ABNORMAL HIGH (ref 4.8–5.6)
Mean Plasma Glucose: 148.46 mg/dL

## 2020-10-01 LAB — CBC
HCT: 49.6 % (ref 39.0–52.0)
Hemoglobin: 16.5 g/dL (ref 13.0–17.0)
MCH: 26.5 pg (ref 26.0–34.0)
MCHC: 33.3 g/dL (ref 30.0–36.0)
MCV: 79.6 fL — ABNORMAL LOW (ref 80.0–100.0)
Platelets: 179 10*3/uL (ref 150–400)
RBC: 6.23 MIL/uL — ABNORMAL HIGH (ref 4.22–5.81)
RDW: 19.7 % — ABNORMAL HIGH (ref 11.5–15.5)
WBC: 5.2 10*3/uL (ref 4.0–10.5)
nRBC: 0 % (ref 0.0–0.2)

## 2020-10-01 LAB — MAGNESIUM: Magnesium: 1.8 mg/dL (ref 1.7–2.4)

## 2020-10-01 NOTE — Progress Notes (Signed)
Pulmonary Critical Care Medicine [facility] [place]   PULMONARY CRITICAL CARE SERVICE  PROGRESS NOTE  Date of Service: 10/01/2020  George Pennington  FKC:[id number]  DOB: [birth year]   DOA: 09/10/2020  Referring Physician: Carron Curie, MD  HPI: George Pennington is a 44 y.o. male seen for follow up of Acute on Chronic Respiratory Failure.  Patient is capping with 2 L and stays on T collar at the time  Medications: Reviewed on Rounds  Physical Exam:  Vitals: Temperature is 97.2 pulse 68 respiratory rate 20 blood pressure 141/55 saturations 95%  Ventilator Settings capping of the ventilator  . General: Comfortable at this time . Eyes: Grossly normal lids, irises & conjunctiva . ENT: grossly tongue is normal . Neck: no obvious mass . Cardiovascular: S1 S2 normal no gallop . Respiratory: No rhonchi no rales noted . Abdomen: soft . Skin: no rash seen on limited exam . Musculoskeletal: not rigid . Psychiatric:unable to assess . Neurologic: no seizure no involuntary movements         Lab Data:   Basic Metabolic Panel: Recent Labs  Lab 09/27/20 0507 10/01/20 0332  NA 134* 136  K 3.9 3.7  CL 99 100  CO2 25 28  GLUCOSE 83 103*  BUN 11 9  CREATININE 1.20 1.33*  CALCIUM 8.9 9.1  MG 1.8 1.8    ABG: Recent Labs  Lab 09/29/20 0920  PHART 7.381  PCO2ART 48.3*  PO2ART 61.8*  HCO3 28.0  O2SAT 90.4    Liver Function Tests: No results for input(s): AST, ALT, ALKPHOS, BILITOT, PROT, ALBUMIN in the last 168 hours. No results for input(s): LIPASE, AMYLASE in the last 168 hours. No results for input(s): AMMONIA in the last 168 hours.  CBC: Recent Labs  Lab 09/27/20 0507 10/01/20 0332  WBC 4.9 5.2  HGB 16.4 16.5  HCT 51.6 49.6  MCV 81.0 79.6*  PLT 152 179    Cardiac Enzymes: No results for input(s): CKTOTAL, CKMB, CKMBINDEX, TROPONINI in the last 168 hours.  BNP (last 3 results) No results for input(s): BNP in the last 8760 hours.  ProBNP (last  3 results) No results for input(s): PROBNP in the last 8760 hours.  Radiological Exams: No results found.  Assessment/Plan Active Problems:   Acute on chronic respiratory failure with hypoxia (HCC)   Obstructive sleep apnea   Pickwickian syndrome (HCC)   Oropharyngeal dysphagia   Stage III chronic kidney disease (HCC)   Morbid obesity (HCC)   1. Acute on chronic respiratory failure hypoxia continue with capping during the daytime and switch to T collar at nighttime 2. Obstructive sleep apnea and pickwickian syndrome continue with current management. 3. Oropharyngeal dysphagia no change 4. Stage III chronic kidney disease monitor 5.  Morbid obesity dietary management   I have personally seen and evaluated the patient, evaluated laboratory and imaging results, formulated the assessment and plan and placed orders. The Patient requires high complexity decision making with multiple systems involvement.  Rounds were done with the Respiratory Therapy Director and Staff therapists and discussed with nursing staff also.  Yevonne Pax, MD [facility] Pulmonary Critical Care Medicine Sleep Medicine

## 2020-10-02 DIAGNOSIS — G4733 Obstructive sleep apnea (adult) (pediatric): Secondary | ICD-10-CM | POA: Diagnosis not present

## 2020-10-02 DIAGNOSIS — R1312 Dysphagia, oropharyngeal phase: Secondary | ICD-10-CM | POA: Diagnosis not present

## 2020-10-02 DIAGNOSIS — J9621 Acute and chronic respiratory failure with hypoxia: Secondary | ICD-10-CM | POA: Diagnosis not present

## 2020-10-02 NOTE — Progress Notes (Signed)
Pulmonary Critical Care Medicine [facility] [place]   PULMONARY CRITICAL CARE SERVICE  PROGRESS NOTE  Date of Service: 10/02/2020  George Pennington  [id number]  DOB: 07-09-1976   DOA: 09/10/2020  Referring Physician: Carron Curie, MD  HPI: George Pennington is a 44 y.o. male seen for follow up of Acute on Chronic Respiratory Failure.  Patient currently is doing well with capping during the daytime and T collar at night  Medications: Reviewed on Rounds  Physical Exam:  Vitals: Temperature is 97.6 pulse 61 respiratory 19 blood pressure is 110/56 saturations 92%  Ventilator Settings on capping trials  . General: Comfortable at this time . Eyes: Grossly normal lids, irises & conjunctiva . ENT: grossly tongue is normal . Neck: no obvious mass . Cardiovascular: S1 S2 normal no gallop . Respiratory: No rhonchi or rales . Abdomen: soft . Skin: no rash seen on limited exam . Musculoskeletal: not rigid . Psychiatric:unable to assess . Neurologic: no seizure no involuntary movements         Lab Data:   Basic Metabolic Panel: Recent Labs  Lab 09/27/20 0507 10/01/20 0332  NA 134* 136  K 3.9 3.7  CL 99 100  CO2 25 28  GLUCOSE 83 103*  BUN 11 9  CREATININE 1.20 1.33*  CALCIUM 8.9 9.1  MG 1.8 1.8    ABG: Recent Labs  Lab 09/29/20 0920  PHART 7.381  PCO2ART 48.3*  PO2ART 61.8*  HCO3 28.0  O2SAT 90.4    Liver Function Tests: No results for input(s): AST, ALT, ALKPHOS, BILITOT, PROT, ALBUMIN in the last 168 hours. No results for input(s): LIPASE, AMYLASE in the last 168 hours. No results for input(s): AMMONIA in the last 168 hours.  CBC: Recent Labs  Lab 09/27/20 0507 10/01/20 0332  WBC 4.9 5.2  HGB 16.4 16.5  HCT 51.6 49.6  MCV 81.0 79.6*  PLT 152 179    Cardiac Enzymes: No results for input(s): CKTOTAL, CKMB, CKMBINDEX, TROPONINI in the last 168 hours.  BNP (last 3 results) No results for input(s): BNP in the last 8760  hours.  ProBNP (last 3 results) No results for input(s): PROBNP in the last 8760 hours.  Radiological Exams: No results found.  Assessment/Plan Active Problems:   Acute on chronic respiratory failure with hypoxia (HCC)   Obstructive sleep apnea   Pickwickian syndrome (HCC)   Oropharyngeal dysphagia   Stage III chronic kidney disease (HCC)   Morbid obesity (HCC)   1. Acute on chronic respiratory failure hypoxia we will continue with capping during the daytime off at night home training has been underway 2. Obstructive sleep apnea pickwickian no change patient is noncompliant with Pap therapy 3. Oropharyngeal dysphagia supportive care 4. Stage III kidney disease baseline labs 5. Morbid obesity dietary managed   I have personally seen and evaluated the patient, evaluated laboratory and imaging results, formulated the assessment and plan and placed orders. The Patient requires high complexity decision making with multiple systems involvement.  Rounds were done with the Respiratory Therapy Director and Staff therapists and discussed with nursing staff also.  Yevonne Pax, MD [facility] Pulmonary Critical Care Medicine Sleep Medicine

## 2020-10-03 DIAGNOSIS — G4733 Obstructive sleep apnea (adult) (pediatric): Secondary | ICD-10-CM | POA: Diagnosis not present

## 2020-10-03 DIAGNOSIS — J9621 Acute and chronic respiratory failure with hypoxia: Secondary | ICD-10-CM | POA: Diagnosis not present

## 2020-10-03 DIAGNOSIS — R1312 Dysphagia, oropharyngeal phase: Secondary | ICD-10-CM | POA: Diagnosis not present

## 2020-10-03 NOTE — Progress Notes (Signed)
Pulmonary Critical Care Medicine [facility] [place]   PULMONARY CRITICAL CARE SERVICE  PROGRESS NOTE  Date of Service: 10/03/2020  George Pennington  [id number]  DOB: June 03, 1976   DOA: 09/10/2020  Referring Physician: Carron Curie, MD  HPI: George Pennington is a 44 y.o. male seen for follow up of Acute on Chronic Respiratory Failure.  Patient currently is on T collar has been on 28% FiO2 with good saturations.  Medications: Reviewed on Rounds  Physical Exam:  Vitals: Temperature 97.3 pulse 64 respiratory rate 20 blood pressure is 103/58 saturations 91%  Ventilator Settings off the ventilator on T collar currently on 28% FiO2  . General: Comfortable at this time . Eyes: Grossly normal lids, irises & conjunctiva . ENT: grossly tongue is normal . Neck: no obvious mass . Cardiovascular: S1 S2 normal no gallop . Respiratory: No rhonchi and no rales are noted at this time . Abdomen: soft . Skin: no rash seen on limited exam . Musculoskeletal: not rigid . Psychiatric:unable to assess . Neurologic: no seizure no involuntary movements         Lab Data:   Basic Metabolic Panel: Recent Labs  Lab 09/27/20 0507 10/01/20 0332  NA 134* 136  K 3.9 3.7  CL 99 100  CO2 25 28  GLUCOSE 83 103*  BUN 11 9  CREATININE 1.20 1.33*  CALCIUM 8.9 9.1  MG 1.8 1.8    ABG: Recent Labs  Lab 09/29/20 0920  PHART 7.381  PCO2ART 48.3*  PO2ART 61.8*  HCO3 28.0  O2SAT 90.4    Liver Function Tests: No results for input(s): AST, ALT, ALKPHOS, BILITOT, PROT, ALBUMIN in the last 168 hours. No results for input(s): LIPASE, AMYLASE in the last 168 hours. No results for input(s): AMMONIA in the last 168 hours.  CBC: Recent Labs  Lab 09/27/20 0507 10/01/20 0332  WBC 4.9 5.2  HGB 16.4 16.5  HCT 51.6 49.6  MCV 81.0 79.6*  PLT 152 179    Cardiac Enzymes: No results for input(s): CKTOTAL, CKMB, CKMBINDEX, TROPONINI in the last 168 hours.  BNP (last 3 results) No  results for input(s): BNP in the last 8760 hours.  ProBNP (last 3 results) No results for input(s): PROBNP in the last 8760 hours.  Radiological Exams: No results found.  Assessment/Plan Active Problems:   Acute on chronic respiratory failure with hypoxia (HCC)   Obstructive sleep apnea   Pickwickian syndrome (HCC)   Oropharyngeal dysphagia   Stage III chronic kidney disease (HCC)   Morbid obesity (HCC)   1. Acute on chronic respiratory failure with hypoxia we will continue with T collar titrate oxygen continue pulmonary toilet. 2. Obstructive sleep apnea no change 3. Oropharyngeal dysphagia at baseline 4. Stage III kidney disease we will monitoring patient's labs 5. Morbid obesity dietary management   I have personally seen and evaluated the patient, evaluated laboratory and imaging results, formulated the assessment and plan and placed orders. The Patient requires high complexity decision making with multiple systems involvement.  Rounds were done with the Respiratory Therapy Director and Staff therapists and discussed with nursing staff also.  Yevonne Pax, MD [facility] Pulmonary Critical Care Medicine Sleep Medicine

## 2020-10-04 DIAGNOSIS — J9621 Acute and chronic respiratory failure with hypoxia: Secondary | ICD-10-CM | POA: Diagnosis not present

## 2020-10-04 DIAGNOSIS — G4733 Obstructive sleep apnea (adult) (pediatric): Secondary | ICD-10-CM | POA: Diagnosis not present

## 2020-10-04 DIAGNOSIS — R1312 Dysphagia, oropharyngeal phase: Secondary | ICD-10-CM | POA: Diagnosis not present

## 2020-10-04 NOTE — Progress Notes (Signed)
Pulmonary Critical Care Medicine [facility] [place]   PULMONARY CRITICAL CARE SERVICE  PROGRESS NOTE  Date of Service: 10/04/2020  Hakim Minniefield  KVQ:[id number]  DOB: 12/03/1975   DOA: 09/10/2020  Referring Physician: Carron Curie, MD  HPI: George Pennington is a 44 y.o. male seen for follow up of Acute on Chronic Respiratory Failure.  Patient currently is capping has been on 2 L  Medications: Reviewed on Rounds  Physical Exam:  Vitals: Temperature is 98.4 pulse 83 respiratory rate 21 blood pressure is 118/76 saturations 98%  Ventilator Settings capping on 2 L  . General: Comfortable at this time . Eyes: Grossly normal lids, irises & conjunctiva . ENT: grossly tongue is normal . Neck: no obvious mass . Cardiovascular: S1 S2 normal no gallop . Respiratory: No rhonchi no rales noted at this time . Abdomen: soft . Skin: no rash seen on limited exam . Musculoskeletal: not rigid . Psychiatric:unable to assess . Neurologic: no seizure no involuntary movements         Lab Data:   Basic Metabolic Panel: Recent Labs  Lab 10/01/20 0332  NA 136  K 3.7  CL 100  CO2 28  GLUCOSE 103*  BUN 9  CREATININE 1.33*  CALCIUM 9.1  MG 1.8    ABG: Recent Labs  Lab 09/29/20 0920  PHART 7.381  PCO2ART 48.3*  PO2ART 61.8*  HCO3 28.0  O2SAT 90.4    Liver Function Tests: No results for input(s): AST, ALT, ALKPHOS, BILITOT, PROT, ALBUMIN in the last 168 hours. No results for input(s): LIPASE, AMYLASE in the last 168 hours. No results for input(s): AMMONIA in the last 168 hours.  CBC: Recent Labs  Lab 10/01/20 0332  WBC 5.2  HGB 16.5  HCT 49.6  MCV 79.6*  PLT 179    Cardiac Enzymes: No results for input(s): CKTOTAL, CKMB, CKMBINDEX, TROPONINI in the last 168 hours.  BNP (last 3 results) No results for input(s): BNP in the last 8760 hours.  ProBNP (last 3 results) No results for input(s): PROBNP in the last 8760 hours.  Radiological Exams: No  results found.  Assessment/Plan Active Problems:   Acute on chronic respiratory failure with hypoxia (HCC)   Obstructive sleep apnea   Pickwickian syndrome (HCC)   Oropharyngeal dysphagia   Stage III chronic kidney disease (HCC)   Morbid obesity (HCC)   1. Acute on chronic respiratory failure hypoxia we will continue with capping trials currently requiring only 2 L 2. Obstructive sleep apnea and pickwickian syndrome no change 3. Oropharyngeal dysphagia patient is at baseline 4. Stage III chronic kidney disease at baseline we will continue to follow 5. Morbid obesity at baseline   I have personally seen and evaluated the patient, evaluated laboratory and imaging results, formulated the assessment and plan and placed orders. The Patient requires high complexity decision making with multiple systems involvement.  Rounds were done with the Respiratory Therapy Director and Staff therapists and discussed with nursing staff also.  Yevonne Pax, MD [facility] Pulmonary Critical Care Medicine Sleep Medicine

## 2020-10-05 DIAGNOSIS — R1312 Dysphagia, oropharyngeal phase: Secondary | ICD-10-CM | POA: Diagnosis not present

## 2020-10-05 DIAGNOSIS — J9621 Acute and chronic respiratory failure with hypoxia: Secondary | ICD-10-CM | POA: Diagnosis not present

## 2020-10-05 DIAGNOSIS — G4733 Obstructive sleep apnea (adult) (pediatric): Secondary | ICD-10-CM | POA: Diagnosis not present

## 2020-10-05 NOTE — Progress Notes (Signed)
Pulmonary Critical Care Medicine [facility] [place]   PULMONARY CRITICAL CARE SERVICE  PROGRESS NOTE  Date of Service: 10/05/2020  [name]  [id number]  DOB: [birth year]   DOA: 09/10/2020  Referring Physician: Carron Curie, MD  HPI: George Pennington is a 44 y.o. male seen for follow up of Acute on Chronic Respiratory Failure.  Patient is capping right now on 2 L of oxygen good saturations are noted.  Medications: Reviewed on Rounds  Physical Exam:  Vitals: Temperature is 97.6 pulse 66 respiratory rate is 20 blood pressure is 130/72 saturations 100  Ventilator Settings capping on 2 L oxygen  . General: Comfortable at this time . Eyes: Grossly normal lids, irises & conjunctiva . ENT: grossly tongue is normal . Neck: no obvious mass . Cardiovascular: S1 S2 normal no gallop . Respiratory: No rhonchi no rales noted . Abdomen: soft . Skin: no rash seen on limited exam . Musculoskeletal: not rigid . Psychiatric:unable to assess . Neurologic: no seizure no involuntary movements         Lab Data:   Basic Metabolic Panel: Recent Labs  Lab 10/01/20 0332  NA 136  K 3.7  CL 100  CO2 28  GLUCOSE 103*  BUN 9  CREATININE 1.33*  CALCIUM 9.1  MG 1.8    ABG: Recent Labs  Lab 09/29/20 0920  PHART 7.381  PCO2ART 48.3*  PO2ART 61.8*  HCO3 28.0  O2SAT 90.4    Liver Function Tests: No results for input(s): AST, ALT, ALKPHOS, BILITOT, PROT, ALBUMIN in the last 168 hours. No results for input(s): LIPASE, AMYLASE in the last 168 hours. No results for input(s): AMMONIA in the last 168 hours.  CBC: Recent Labs  Lab 10/01/20 0332  WBC 5.2  HGB 16.5  HCT 49.6  MCV 79.6*  PLT 179    Cardiac Enzymes: No results for input(s): CKTOTAL, CKMB, CKMBINDEX, TROPONINI in the last 168 hours.  BNP (last 3 results) No results for input(s): BNP in the last 8760 hours.  ProBNP (last 3 results) No results for input(s): PROBNP in the last 8760  hours.  Radiological Exams: No results found.  Assessment/Plan Active Problems:   Acute on chronic respiratory failure with hypoxia (HCC)   Obstructive sleep apnea   Pickwickian syndrome (HCC)   Oropharyngeal dysphagia   Stage III chronic kidney disease (HCC)   Morbid obesity (HCC)   1. Acute on chronic respiratory failure hypoxia we will continue with capping trials titrate oxygen continue pulmonary toilet 2. Obstructive sleep apnea pickwickian syndrome no change continue with supportive care 3. Oropharyngeal dysphagia patient is at baseline 4. Stage III chronic kidney disease following labs 5. Morbid obesity dietary   I have personally seen and evaluated the patient, evaluated laboratory and imaging results, formulated the assessment and plan and placed orders. The Patient requires high complexity decision making with multiple systems involvement.  Rounds were done with the Respiratory Therapy Director and Staff therapists and discussed with nursing staff also.  Yevonne Pax, MD [facility] Pulmonary Critical Care Medicine Sleep Medicine

## 2020-10-06 DIAGNOSIS — R1312 Dysphagia, oropharyngeal phase: Secondary | ICD-10-CM | POA: Diagnosis not present

## 2020-10-06 DIAGNOSIS — G4733 Obstructive sleep apnea (adult) (pediatric): Secondary | ICD-10-CM | POA: Diagnosis not present

## 2020-10-06 DIAGNOSIS — J9621 Acute and chronic respiratory failure with hypoxia: Secondary | ICD-10-CM | POA: Diagnosis not present

## 2020-10-06 NOTE — Progress Notes (Signed)
Pulmonary Critical Care Medicine [facility] [place]   PULMONARY CRITICAL CARE SERVICE  PROGRESS NOTE  Date of Service: 10/06/2020  George Pennington  XLK:[id number]  DOB: Jun 03, 1976   DOA: 09/10/2020  Referring Physician: Carron Curie, MD  HPI: George Pennington is a 44 y.o. male seen for follow up of Acute on Chronic Respiratory Failure.  Patient is on T collar appears to be doing fine awaiting discharge planning training  Medications: Reviewed on Rounds  Physical Exam:  Vitals: Temperature is 98.2 pulse 68 respiratory rate is 20 blood pressure is 119/70 saturations 96%  Ventilator Settings on T collar with an FiO2 of 28%  . General: Comfortable at this time . Eyes: Grossly normal lids, irises & conjunctiva . ENT: grossly tongue is normal . Neck: no obvious mass . Cardiovascular: S1 S2 normal no gallop . Respiratory: Scattered rhonchi expansion is equal . Abdomen: soft . Skin: no rash seen on limited exam . Musculoskeletal: not rigid . Psychiatric:unable to assess . Neurologic: no seizure no involuntary movements         Lab Data:   Basic Metabolic Panel: Recent Labs  Lab 10/01/20 0332  NA 136  K 3.7  CL 100  CO2 28  GLUCOSE 103*  BUN 9  CREATININE 1.33*  CALCIUM 9.1  MG 1.8    ABG: Recent Labs  Lab 09/29/20 0920  PHART 7.381  PCO2ART 48.3*  PO2ART 61.8*  HCO3 28.0  O2SAT 90.4    Liver Function Tests: No results for input(s): AST, ALT, ALKPHOS, BILITOT, PROT, ALBUMIN in the last 168 hours. No results for input(s): LIPASE, AMYLASE in the last 168 hours. No results for input(s): AMMONIA in the last 168 hours.  CBC: Recent Labs  Lab 10/01/20 0332  WBC 5.2  HGB 16.5  HCT 49.6  MCV 79.6*  PLT 179    Cardiac Enzymes: No results for input(s): CKTOTAL, CKMB, CKMBINDEX, TROPONINI in the last 168 hours.  BNP (last 3 results) No results for input(s): BNP in the last 8760 hours.  ProBNP (last 3 results) No results for input(s):  PROBNP in the last 8760 hours.  Radiological Exams: No results found.  Assessment/Plan Active Problems:   Acute on chronic respiratory failure with hypoxia (HCC)   Obstructive sleep apnea   Pickwickian syndrome (HCC)   Oropharyngeal dysphagia   Stage III chronic kidney disease (HCC)   Morbid obesity (HCC)   1. Acute on chronic respiratory failure with hypoxia we will continue with T collar continue secretion management supportive care. 2. Obstructive sleep apnea pickwickian syndrome no change we will continue to follow 3. Oropharyngeal dysphagia at baseline 4. Stage III chronic kidney disease supportive care 5. Morbid obesity dietary manage   I have personally seen and evaluated the patient, evaluated laboratory and imaging results, formulated the assessment and plan and placed orders. The Patient requires high complexity decision making with multiple systems involvement.  Rounds were done with the Respiratory Therapy Director and Staff therapists and discussed with nursing staff also.  Yevonne Pax, MD [facility] Pulmonary Critical Care Medicine Sleep Medicine

## 2021-04-27 IMAGING — DX DG ABD PORTABLE 1V
2 series · 2 of 2 positions shown · non-contrast
Comparison: 09/12/2020

CLINICAL DATA: Constipation and ileus

EXAM:
PORTABLE ABDOMEN - 1 VIEW

[abdomen kub (1 of 2)]
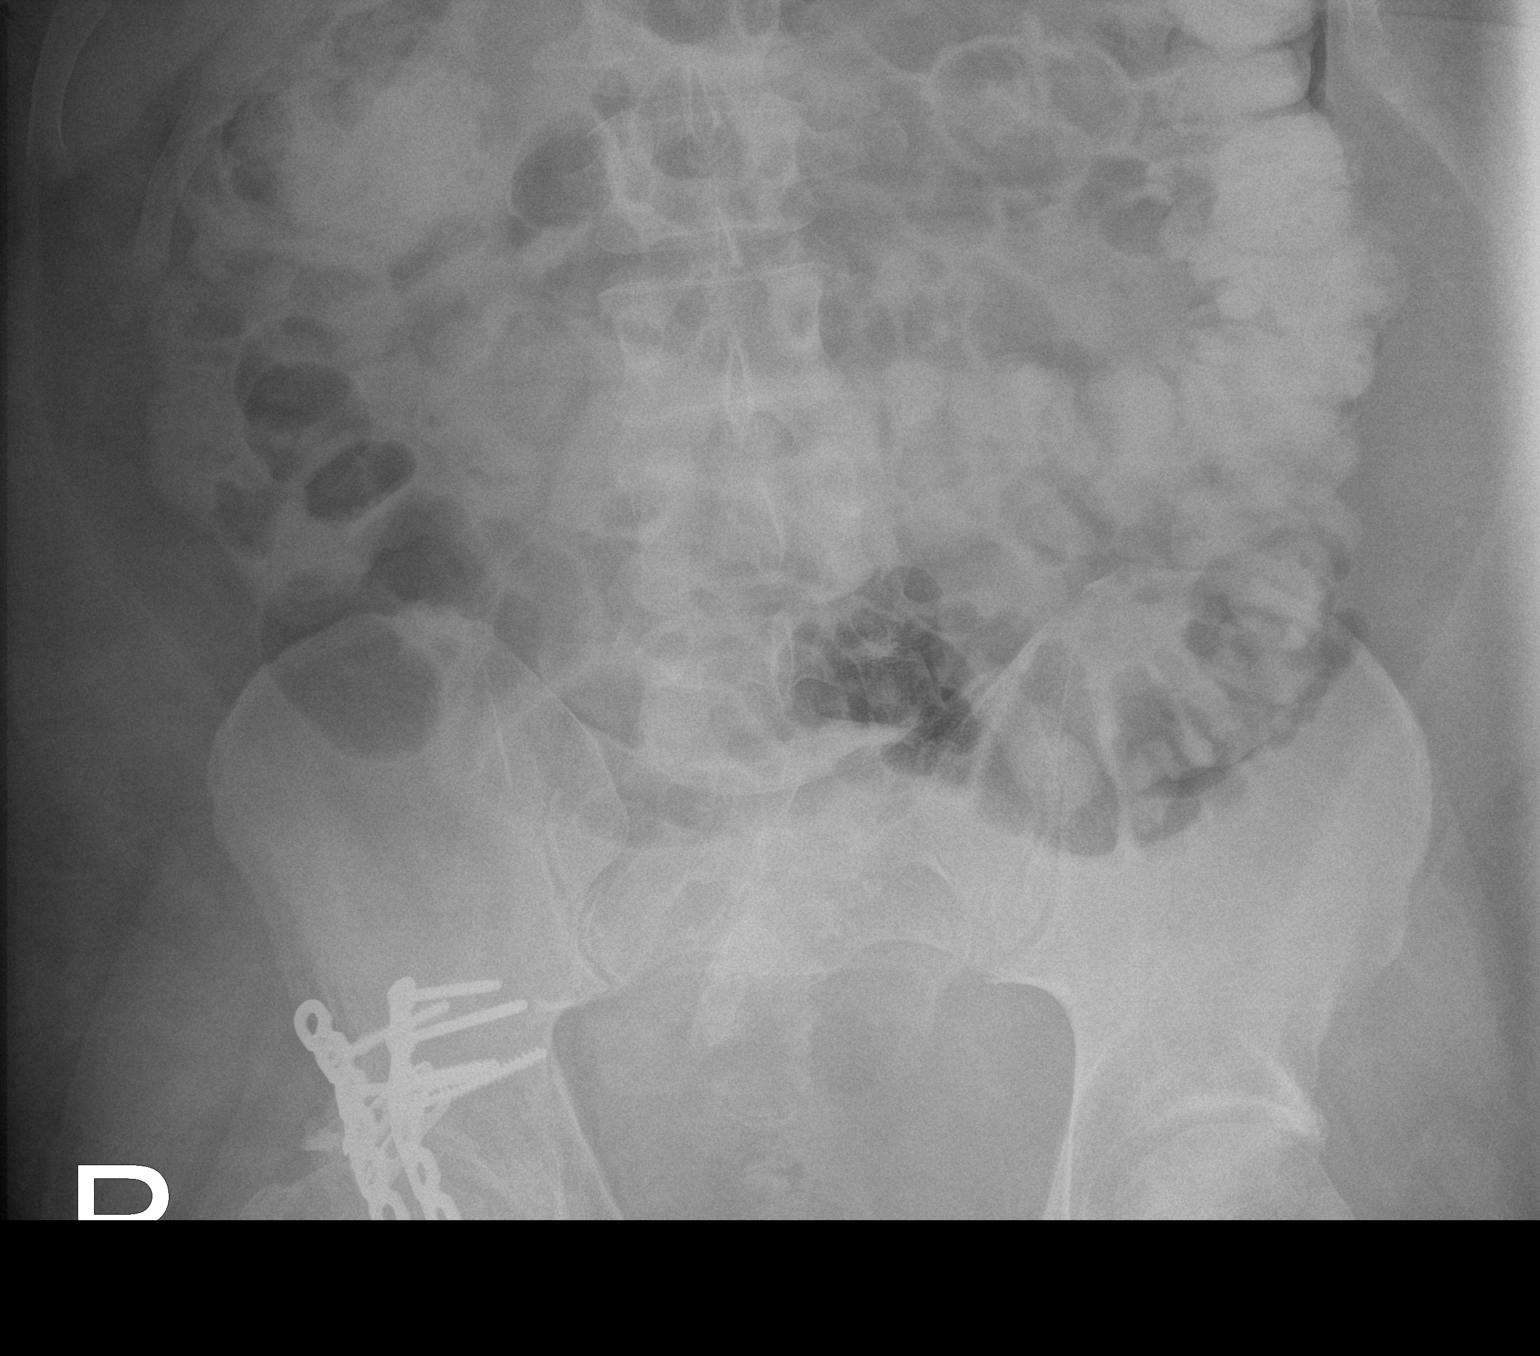

[abdomen kub (2 of 2)]
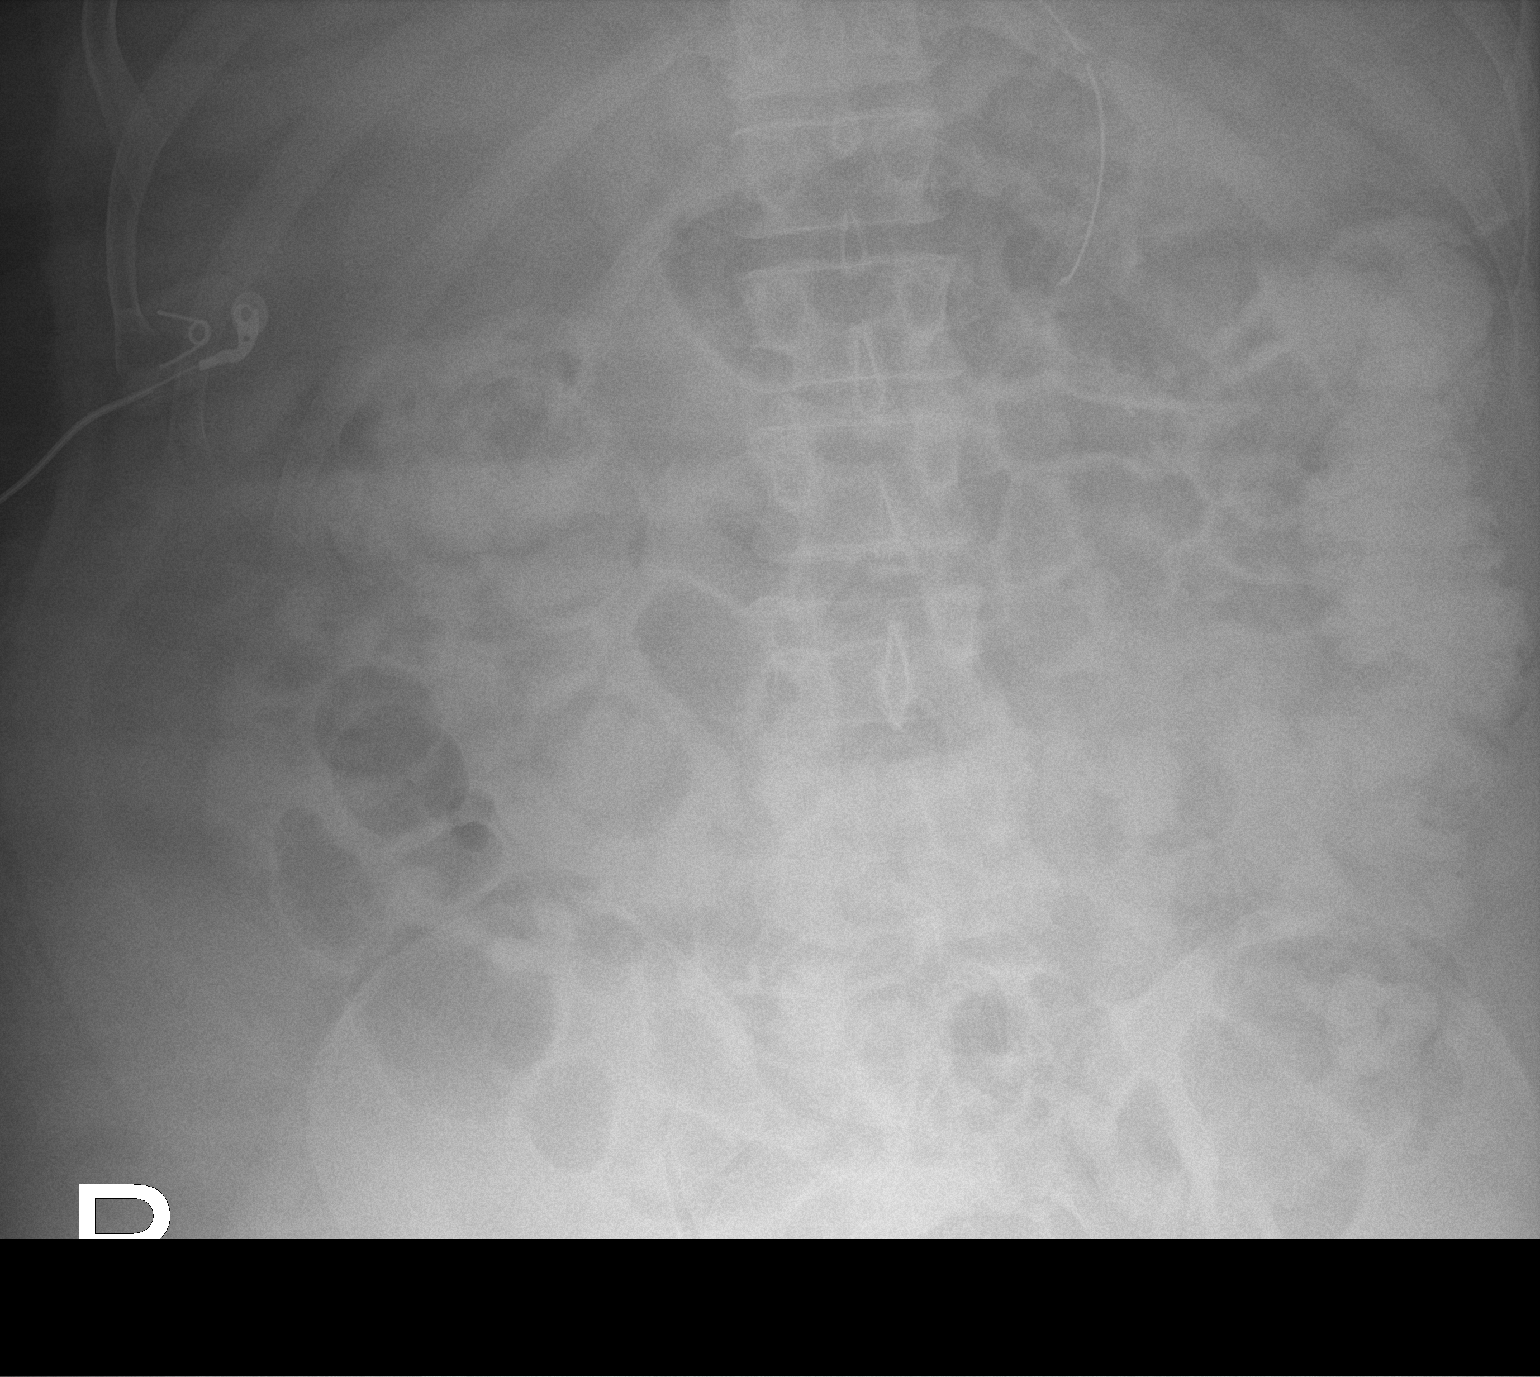

[2 of 2 positions shown; findings below may reference images not displayed]

FINDINGS: NG tube in the body the stomach.

Gas in nondilated large and small bowel loops. Contrast in the colon
from prior contrast ingestion.

Right acetabular fracture fixation with hardware.
IMPRESSION: NG tube in the stomach. No dilated bowel loops. Contrast in the
colon.
